# Patient Record
Sex: Male | Born: 1980 | Race: White | Hispanic: No | Marital: Single | Smoking: Former smoker
Health system: Southern US, Community
[De-identification: ages and names within clinical notes are randomized; demographics above are authoritative.]

## PROBLEM LIST (undated history)

## (undated) DIAGNOSIS — F419 Anxiety disorder, unspecified: Secondary | ICD-10-CM

## (undated) DIAGNOSIS — K589 Irritable bowel syndrome without diarrhea: Secondary | ICD-10-CM

## (undated) DIAGNOSIS — E669 Obesity, unspecified: Secondary | ICD-10-CM

## (undated) DIAGNOSIS — F988 Other specified behavioral and emotional disorders with onset usually occurring in childhood and adolescence: Secondary | ICD-10-CM

## (undated) DIAGNOSIS — E785 Hyperlipidemia, unspecified: Secondary | ICD-10-CM

## (undated) DIAGNOSIS — S82899A Other fracture of unspecified lower leg, initial encounter for closed fracture: Secondary | ICD-10-CM

## (undated) DIAGNOSIS — G4730 Sleep apnea, unspecified: Secondary | ICD-10-CM

## (undated) DIAGNOSIS — F32A Depression, unspecified: Secondary | ICD-10-CM

## (undated) DIAGNOSIS — K219 Gastro-esophageal reflux disease without esophagitis: Secondary | ICD-10-CM

## (undated) DIAGNOSIS — I10 Essential (primary) hypertension: Secondary | ICD-10-CM

## (undated) HISTORY — DX: Obesity, unspecified: E66.9

## (undated) HISTORY — PX: TONSILLECTOMY: SUR1361

## (undated) HISTORY — DX: Other specified behavioral and emotional disorders with onset usually occurring in childhood and adolescence: F98.8

## (undated) HISTORY — DX: Depression, unspecified: F32.A

## (undated) HISTORY — DX: Anxiety disorder, unspecified: F41.9

## (undated) HISTORY — DX: Irritable bowel syndrome, unspecified: K58.9

## (undated) HISTORY — PX: ANKLE FRACTURE SURGERY: SHX122

## (undated) HISTORY — DX: Hyperlipidemia, unspecified: E78.5

## (undated) HISTORY — DX: Gastro-esophageal reflux disease without esophagitis: K21.9

## (undated) HISTORY — DX: Essential (primary) hypertension: I10

## (undated) HISTORY — DX: Sleep apnea, unspecified: G47.30

## (undated) HISTORY — PX: CIRCUMCISION: SHX1350

## (undated) HISTORY — DX: Other fracture of unspecified lower leg, initial encounter for closed fracture: S82.899A

## (undated) HISTORY — PX: WISDOM TOOTH EXTRACTION: SHX21

---

## 2004-01-28 ENCOUNTER — Encounter: Admission: RE | Admit: 2004-01-28 | Discharge: 2004-01-28 | Payer: Self-pay | Admitting: Specialist

## 2006-07-22 ENCOUNTER — Ambulatory Visit (HOSPITAL_COMMUNITY): Admission: RE | Admit: 2006-07-22 | Discharge: 2006-07-22 | Payer: Self-pay | Admitting: Otolaryngology

## 2006-07-28 ENCOUNTER — Emergency Department (HOSPITAL_COMMUNITY): Admission: EM | Admit: 2006-07-28 | Discharge: 2006-07-28 | Payer: Self-pay | Admitting: Emergency Medicine

## 2010-11-03 ENCOUNTER — Emergency Department (HOSPITAL_COMMUNITY)
Admission: EM | Admit: 2010-11-03 | Discharge: 2010-11-03 | Disposition: A | Discharge status: Home / Self Care | Payer: BC Managed Care – PPO | Attending: Emergency Medicine | Admitting: Emergency Medicine

## 2010-11-03 DIAGNOSIS — H9190 Unspecified hearing loss, unspecified ear: Secondary | ICD-10-CM | POA: Insufficient documentation

## 2010-11-03 DIAGNOSIS — E669 Obesity, unspecified: Secondary | ICD-10-CM | POA: Insufficient documentation

## 2010-11-03 DIAGNOSIS — H9209 Otalgia, unspecified ear: Secondary | ICD-10-CM | POA: Insufficient documentation

## 2010-11-03 DIAGNOSIS — H60399 Other infective otitis externa, unspecified ear: Secondary | ICD-10-CM | POA: Insufficient documentation

## 2010-11-04 ENCOUNTER — Emergency Department (HOSPITAL_COMMUNITY)
Admission: EM | Admit: 2010-11-04 | Discharge: 2010-11-04 | Disposition: A | Discharge status: Home / Self Care | Payer: BC Managed Care – PPO | Attending: Emergency Medicine | Admitting: Emergency Medicine

## 2010-11-04 DIAGNOSIS — H60399 Other infective otitis externa, unspecified ear: Secondary | ICD-10-CM | POA: Insufficient documentation

## 2011-12-04 ENCOUNTER — Emergency Department (HOSPITAL_COMMUNITY): Payer: BC Managed Care – PPO

## 2011-12-04 ENCOUNTER — Encounter (HOSPITAL_COMMUNITY): Payer: Self-pay | Admitting: *Deleted

## 2011-12-04 ENCOUNTER — Emergency Department (HOSPITAL_COMMUNITY)
Admission: EM | Admit: 2011-12-04 | Discharge: 2011-12-04 | Disposition: A | Discharge status: Home / Self Care | Payer: BC Managed Care – PPO | Attending: Emergency Medicine | Admitting: Emergency Medicine

## 2011-12-04 DIAGNOSIS — R079 Chest pain, unspecified: Secondary | ICD-10-CM | POA: Insufficient documentation

## 2011-12-04 LAB — POCT I-STAT, CHEM 8
Calcium, Ion: 1.24 mmol/L — ABNORMAL HIGH (ref 1.12–1.23)
Chloride: 103 mEq/L (ref 96–112)
HCT: 42 % (ref 39.0–52.0)
Hemoglobin: 14.3 g/dL (ref 13.0–17.0)
Sodium: 140 mEq/L (ref 135–145)
TCO2: 25 mmol/L (ref 0–100)

## 2011-12-04 LAB — CBC
Hemoglobin: 14.9 g/dL (ref 13.0–17.0)
MCH: 31.8 pg (ref 26.0–34.0)
MCV: 89.5 fL (ref 78.0–100.0)
Platelets: 256 10*3/uL (ref 150–400)
RBC: 4.68 MIL/uL (ref 4.22–5.81)
WBC: 10.4 10*3/uL (ref 4.0–10.5)

## 2011-12-04 LAB — POCT I-STAT TROPONIN I: Troponin i, poc: 0 ng/mL (ref 0.00–0.08)

## 2011-12-04 MED ORDER — OXYCODONE-ACETAMINOPHEN 5-325 MG PO TABS
1.0000 | ORAL_TABLET | Freq: Once | ORAL | Status: AC
  Administered 2011-12-04: 1 via ORAL
  Filled 2011-12-04: qty 1

## 2011-12-04 MED ORDER — IBUPROFEN 600 MG PO TABS: 600.0000 mg | ORAL_TABLET | Freq: Four times a day (QID) | ORAL | Status: AC | PRN

## 2011-12-04 MED ORDER — OXYCODONE-ACETAMINOPHEN 5-325 MG PO TABS: 1.0000 | ORAL_TABLET | Freq: Four times a day (QID) | ORAL | Status: AC | PRN

## 2011-12-04 NOTE — ED Provider Notes (Signed)
History     CSN: [id number]  Arrival date & time 12/04/11  0007   First MD Initiated Contact with Patient 12/04/11 0150      Chief Complaint  Patient presents with  . Chest Pain    chest tightness    (Consider location/radiation/quality/duration/timing/severity/associated sxs/prior treatment) Patient is a 31 y.o. male presenting with chest pain. The history is provided by the patient.  Chest Pain Primary symptoms include shortness of breath and dizziness. Pertinent negatives for primary symptoms include no abdominal pain, no nausea and no vomiting.  Dizziness does not occur with nausea, vomiting or weakness.  Pertinent negatives for associated symptoms include no numbness and no weakness.    patient's had intermittent chest pain for the last month. He states chest pain is resolved he now has pain in his left armpit. It is worse with movement and certain positions. It began after she reached in a certain position. Tonight he had episode of lightheadedness with the episode. No cough. He location get some shortness of breath. No nausea vomiting diarrhea. He does not smoke. He does not have a family history of early cardiac disease.  History reviewed. No pertinent past medical history.  Past Surgical History  Procedure Date  . Tonsillectomy     No family history on file.  History  Substance Use Topics  . Smoking status: Never Smoker   . Smokeless tobacco: Not on file  . Alcohol Use: Yes     occasional      Review of Systems  Constitutional: Negative for activity change and appetite change.  HENT: Negative for neck stiffness.   Eyes: Negative for pain.  Respiratory: Positive for shortness of breath. Negative for chest tightness.   Cardiovascular: Positive for chest pain. Negative for leg swelling.  Gastrointestinal: Negative for nausea, vomiting, abdominal pain and diarrhea.  Genitourinary: Negative for flank pain.  Musculoskeletal: Negative for back pain.  Skin:  Negative for rash.  Neurological: Positive for dizziness. Negative for weakness, numbness and headaches.  Psychiatric/Behavioral: Negative for behavioral problems.    Allergies  Review of patient's allergies indicates no known allergies.  Home Medications   Current Outpatient Rx  Name Route Sig Dispense Refill  . ASPIRIN 81 MG PO CHEW Oral Chew 81 mg by mouth once.    . ASPIRIN EC 81 MG PO TBEC Oral Take 81 mg by mouth daily.    Marland Kitchen FEXOFENADINE HCL 180 MG PO TABS Oral Take 180 mg by mouth daily.    Marland Kitchen NITROGLYCERIN 0.4 MG SL SUBL Sublingual Place 0.4 mg under the tongue every 5 (five) minutes as needed. For chest pain-one dose toinight    . IBUPROFEN 600 MG PO TABS Oral Take 1 tablet (600 mg total) by mouth every 6 (six) hours as needed for pain. 20 tablet 0  . OXYCODONE-ACETAMINOPHEN 5-325 MG PO TABS Oral Take 1-2 tablets by mouth every 6 (six) hours as needed for pain. 20 tablet 0    BP 122/83  Pulse 77  Temp 98 F (36.7 C) (Oral)  Resp 20  Ht 6\' 1"  (1.854 m)  Wt 340 lb (154.223 kg)  BMI 44.86 kg/m2  SpO2 96%  Physical Exam  Nursing note and vitals reviewed. Constitutional: He is oriented to person, place, and time. He appears well-developed and well-nourished.       Patient is obese  HENT:  Head: Normocephalic and atraumatic.  Eyes: EOM are normal. Pupils are equal, round, and reactive to light.  Neck: Normal range of motion. Neck  supple.  Cardiovascular: Normal rate, regular rhythm and normal heart sounds.   No murmur heard. Pulmonary/Chest: Effort normal and breath sounds normal. He exhibits tenderness.       Tenderness in left axilla or area trapezius. No fluctuance. No mass. No erythema.  Abdominal: Soft. Bowel sounds are normal. He exhibits no distension and no mass. There is no tenderness. There is no rebound and no guarding.  Musculoskeletal: Normal range of motion. He exhibits no edema.  Neurological: He is alert and oriented to person, place, and time. No cranial  nerve deficit.  Skin: Skin is warm and dry.  Psychiatric: He has a normal mood and affect.    ED Course  Procedures (including critical care time)  Labs Reviewed  POCT I-STAT, CHEM 8 - Abnormal; Notable for the following:    Calcium, Ion 1.24 (*)     All other components within normal limits  CBC  D-DIMER, QUANTITATIVE  POCT I-STAT TROPONIN I   Dg Chest 2 View  12/04/2011  *RADIOLOGY REPORT*  Clinical Data: Chest pain.  CHEST - 2 VIEW  Comparison: None.  Findings: There are low lung volumes with resulting bibasilar atelectasis.  The lungs are otherwise clear.  There is no pleural effusion or pneumothorax.  Heart size and mediastinal contours are normal.  The osseous structures appear unremarkable.  IMPRESSION: Bibasilar atelectasis.  No other active cardiopulmonary process.  Original Report Authenticated By: Gerrianne Scale, M.D.     1. Chest pain      Date: 12/04/2011  Rate: 70  Rhythm: normal sinus rhythm  QRS Axis: normal  Intervals: normal  ST/T Wave abnormalities: normal  Conduction Disutrbances:none  Narrative Interpretation:   Old EKG Reviewed: none available   MDM  Patient with left-sided chest pain. EKG is reassuring as are cardiac enzymes and d-dimer. Patient is very tender over trapezius and this may be muscular. Patient be discharged home with pain medications and will followup as needed.        Juliet Rude. Rubin Payor, MD 12/04/11 [phone]

## 2011-12-04 NOTE — ED Notes (Signed)
31/M brought himself to er for c/o tightness is chest that began yesterday morning. Pt stated, "My chest started hurting again last night when I was trying to go to sleep." Pt also c/o dizziness at onset of symptoms. Pt denies chest pain at this time. Pt aao x 4, placed on cardiac monitor and spo2 monitor. ekg complete. Given to md. Awaiting new orders. Will continue to monitor pt.

## 2011-12-04 NOTE — ED Notes (Signed)
Pt ambulatory to bathroom with balanced and steady gait. NAD at this time. Will continue to monitor pt.

## 2011-12-04 NOTE — ED Notes (Signed)
Pt has had intermittent chest tightness and L axial pain x 1 month.  Tonight he began feeling lightheaded as well as having L sided chest tightness, so he decided to come in.  Pt thinks he may have may have injured his arm by overstretching, but he's not sure.

## 2013-03-15 ENCOUNTER — Other Ambulatory Visit: Payer: Self-pay | Admitting: Family Medicine

## 2013-03-15 DIAGNOSIS — H547 Unspecified visual loss: Secondary | ICD-10-CM

## 2013-03-20 ENCOUNTER — Ambulatory Visit
Admission: RE | Admit: 2013-03-20 | Discharge: 2013-03-20 | Disposition: A | Discharge status: Home / Self Care | Payer: BC Managed Care – PPO | Source: Ambulatory Visit | Attending: Family Medicine | Admitting: Family Medicine

## 2013-03-20 DIAGNOSIS — H547 Unspecified visual loss: Secondary | ICD-10-CM

## 2015-01-21 ENCOUNTER — Encounter (INDEPENDENT_AMBULATORY_CARE_PROVIDER_SITE_OTHER): Payer: BLUE CROSS/BLUE SHIELD | Admitting: Ophthalmology

## 2015-01-21 DIAGNOSIS — H5213 Myopia, bilateral: Secondary | ICD-10-CM | POA: Diagnosis not present

## 2015-01-21 DIAGNOSIS — G453 Amaurosis fugax: Secondary | ICD-10-CM

## 2015-01-21 DIAGNOSIS — H43813 Vitreous degeneration, bilateral: Secondary | ICD-10-CM

## 2015-01-21 DIAGNOSIS — H538 Other visual disturbances: Secondary | ICD-10-CM | POA: Diagnosis not present

## 2015-04-16 ENCOUNTER — Emergency Department (HOSPITAL_COMMUNITY)
Admission: EM | Admit: 2015-04-16 | Discharge: 2015-04-16 | Disposition: A | Discharge status: Home / Self Care | Payer: BLUE CROSS/BLUE SHIELD | Attending: Emergency Medicine | Admitting: Emergency Medicine

## 2015-04-16 ENCOUNTER — Emergency Department (HOSPITAL_COMMUNITY): Payer: BLUE CROSS/BLUE SHIELD

## 2015-04-16 ENCOUNTER — Other Ambulatory Visit: Payer: Self-pay | Admitting: Gastroenterology

## 2015-04-16 DIAGNOSIS — R1900 Intra-abdominal and pelvic swelling, mass and lump, unspecified site: Secondary | ICD-10-CM | POA: Insufficient documentation

## 2015-04-16 DIAGNOSIS — Z79899 Other long term (current) drug therapy: Secondary | ICD-10-CM | POA: Insufficient documentation

## 2015-04-16 DIAGNOSIS — K219 Gastro-esophageal reflux disease without esophagitis: Secondary | ICD-10-CM

## 2015-04-16 DIAGNOSIS — R1013 Epigastric pain: Secondary | ICD-10-CM | POA: Diagnosis present

## 2015-04-16 LAB — URINALYSIS, ROUTINE W REFLEX MICROSCOPIC
Bilirubin Urine: NEGATIVE
Glucose, UA: NEGATIVE mg/dL
Hgb urine dipstick: NEGATIVE
Ketones, ur: NEGATIVE mg/dL
LEUKOCYTES UA: NEGATIVE
NITRITE: NEGATIVE
PH: 5.5 (ref 5.0–8.0)
Protein, ur: NEGATIVE mg/dL
SPECIFIC GRAVITY, URINE: 1.016 (ref 1.005–1.030)

## 2015-04-16 LAB — COMPREHENSIVE METABOLIC PANEL
ALBUMIN: 4.2 g/dL (ref 3.5–5.0)
ALT: 64 U/L — ABNORMAL HIGH (ref 17–63)
ANION GAP: 8 (ref 5–15)
AST: 40 U/L (ref 15–41)
Alkaline Phosphatase: 51 U/L (ref 38–126)
BILIRUBIN TOTAL: 0.5 mg/dL (ref 0.3–1.2)
BUN: 9 mg/dL (ref 6–20)
CO2: 26 mmol/L (ref 22–32)
Calcium: 9.4 mg/dL (ref 8.9–10.3)
Chloride: 103 mmol/L (ref 101–111)
Creatinine, Ser: 1.06 mg/dL (ref 0.61–1.24)
GLUCOSE: 98 mg/dL (ref 65–99)
POTASSIUM: 4 mmol/L (ref 3.5–5.1)
Sodium: 137 mmol/L (ref 135–145)
TOTAL PROTEIN: 7 g/dL (ref 6.5–8.1)

## 2015-04-16 LAB — CBC
HEMATOCRIT: 44.4 % (ref 39.0–52.0)
HEMOGLOBIN: 15.5 g/dL (ref 13.0–17.0)
MCH: 32.2 pg (ref 26.0–34.0)
MCHC: 34.9 g/dL (ref 30.0–36.0)
MCV: 92.3 fL (ref 78.0–100.0)
Platelets: 261 10*3/uL (ref 150–400)
RBC: 4.81 MIL/uL (ref 4.22–5.81)
RDW: 12.7 % (ref 11.5–15.5)
WBC: 9.3 10*3/uL (ref 4.0–10.5)

## 2015-04-16 LAB — TROPONIN I

## 2015-04-16 LAB — LIPASE, BLOOD: Lipase: 31 U/L (ref 11–51)

## 2015-04-16 MED ORDER — PANTOPRAZOLE SODIUM 40 MG PO TBEC
40.0000 mg | DELAYED_RELEASE_TABLET | Freq: Once | ORAL | Status: AC
  Administered 2015-04-16: 40 mg via ORAL
  Filled 2015-04-16: qty 1

## 2015-04-16 MED ORDER — PANTOPRAZOLE SODIUM 40 MG PO TBEC: 40.0000 mg | DELAYED_RELEASE_TABLET | Freq: Every day | ORAL | Status: DC

## 2015-04-16 NOTE — ED Provider Notes (Signed)
CSN: [id number]     Arrival date & time 04/16/15  1611 History   First MD Initiated Contact with Patient 04/16/15 1840     Chief Complaint  Patient presents with  . Abdominal Pain     (Consider location/radiation/quality/duration/timing/severity/associated sxs/prior Treatment) Patient is a 34 y.o. male presenting with abdominal pain. The history is provided by the patient.  Abdominal Pain He has been having epigastric pain with radiation to the back. This started 4 days ago. Is worse with eating and worse with laying flat and better if he stands up or if he doesn't eat. There was associated dyspnea and some diaphoresis but no nausea. He saw a gastroenterologist today who added scheduled him for an ultrasound to rule out gallstones and had ordered him some medicine for acid reflux which he has not picked up yet. He states he is currently symptom-free. He came to the ED today because he had noted some swelling in the left upper abdomen which has subsided. He is not able to rate his pain currently. He does describe it as a dull, burning, pressure type of pain. He does have history of hypertension but no history of diabetes or hyperlipidemia and is a nonsmoker.  No past medical history on file. Past Surgical History  Procedure Laterality Date  . Tonsillectomy     No family history on file. Social History  Substance Use Topics  . Smoking status: Never Smoker   . Smokeless tobacco: Not on file  . Alcohol Use: Yes     Comment: occasional    Review of Systems  Gastrointestinal: Positive for abdominal pain.  All other systems reviewed and are negative.     Allergies  Asa  Home Medications   Prior to Admission medications   Medication Sig Start Date End Date Taking? Authorizing Provider  fexofenadine (ALLEGRA) 180 MG tablet Take 180 mg by mouth daily.   Yes Historical Provider, MD  lisinopril (PRINIVIL,ZESTRIL) 10 MG tablet Take 10 mg by mouth daily. 04/01/15  Yes Historical  Provider, MD  nitroGLYCERIN (NITROSTAT) 0.4 MG SL tablet Place 0.4 mg under the tongue every 5 (five) minutes as needed. For chest pain-one dose toinight   Yes Historical Provider, MD   BP 128/71 mmHg  Pulse 98  Temp(Src) 98.7 F (37.1 C) (Oral)  Resp 20  SpO2 95% Physical Exam  Nursing note and vitals reviewed.  Obese 34 year old male, resting comfortably and in no acute distress. Vital signs are normal. Oxygen saturation is 95%, which is normal. Head is normocephalic and atraumatic. PERRLA, EOMI. Oropharynx is clear. Neck is nontender and supple without adenopathy or JVD. Back is nontender and there is no CVA tenderness. Lungs are clear without rales, wheezes, or rhonchi. Chest is nontender. Heart has regular rate and rhythm without murmur. Abdomen is soft, flat, with mild epigastric tenderness. There is no rebound or guarding. There are no masses or hepatosplenomegaly and peristalsis is normoactive. Extremities have 1+ edema, full range of motion is present. Skin is warm and dry without rash. Neurologic: Mental status is normal, cranial nerves are intact, there are no motor or sensory deficits.  ED Course  Procedures (including critical care time) Labs Review Results for orders placed or performed during the hospital encounter of 04/16/15  Lipase, blood  Result Value Ref Range   Lipase 31 11 - 51 U/L  Comprehensive metabolic panel  Result Value Ref Range   Sodium 137 135 - 145 mmol/L   Potassium 4.0 3.5 - 5.1 mmol/L  Chloride 103 101 - 111 mmol/L   CO2 26 22 - 32 mmol/L   Glucose, Bld 98 65 - 99 mg/dL   BUN 9 6 - 20 mg/dL   Creatinine, Ser 1.61 0.61 - 1.24 mg/dL   Calcium 9.4 8.9 - 09.6 mg/dL   Total Protein 7.0 6.5 - 8.1 g/dL   Albumin 4.2 3.5 - 5.0 g/dL   AST 40 15 - 41 U/L   ALT 64 (H) 17 - 63 U/L   Alkaline Phosphatase 51 38 - 126 U/L   Total Bilirubin 0.5 0.3 - 1.2 mg/dL   GFR calc non Af Amer >60 >60 mL/min   GFR calc Af Amer >60 >60 mL/min   Anion gap 8 5 -  15  CBC  Result Value Ref Range   WBC 9.3 4.0 - 10.5 K/uL   RBC 4.81 4.22 - 5.81 MIL/uL   Hemoglobin 15.5 13.0 - 17.0 g/dL   HCT 04.5 40.9 - 81.1 %   MCV 92.3 78.0 - 100.0 fL   MCH 32.2 26.0 - 34.0 pg   MCHC 34.9 30.0 - 36.0 g/dL   RDW 91.4 78.2 - 95.6 %   Platelets 261 150 - 400 K/uL    Imaging Review US Abdomen Complete  04/16/2015  CLINICAL DATA:  Left upper quadrant pain since 4 days ago.  Obesity. EXAM: ULTRASOUND ABDOMEN COMPLETE COMPARISON:  None. FINDINGS: Gallbladder: No gallstones or wall thickening visualized. No sonographic Murphy sign noted. Common bile duct: Diameter: 4 mm Liver: Diffuse coarse increased echogenicity. Liver is difficult to visualize and penetrate penetrate with ultrasound. No intrahepatic biliary dilatation. Limited assessment for a focal hepatic abnormality. IVC: Not visualized Pancreas: Not visualized Spleen: Size and appearance within normal limits. Right Kidney: Length: 11 cm. Echogenicity within normal limits. No mass or hydronephrosis visualized. Left Kidney: Length: 11.7 cm. Echogenicity within normal limits. No mass or hydronephrosis visualized. Abdominal aorta: No aneurysm visualized. Other findings: None. IMPRESSION: Limited exam because of obesity. Normal gallbladder without gallstones or biliary dilatation Marked hepatic steatosis limiting liver evaluation by ultrasound Nonvisualization of the IVC and pancreas Electronically Signed   By: Judie Petit.  Shick M.D.   On: 04/16/2015 20:16   I have personally reviewed and evaluated these images and lab results as part of my medical decision-making.   EKG Interpretation   Date/Time:  Tuesday April 16 2015 19:23:10 EST Ventricular Rate:  78 PR Interval:  189 QRS Duration: 98 QT Interval:  375 QTC Calculation: 427 R Axis:   1 Text Interpretation:  Sinus rhythm ST elev, probable normal early repol  pattern Baseline wander in lead(s) V2 When compared with ECG of 12/04/2011,  No significant change was found  Confirmed by [facility]  MD, Ashritha Desrosiers ([id number]) on  04/16/2015 7:53:53 PM      MDM   Final diagnoses:  Epigastric pain    Epigastric pain with radiation to the back which is suspicious for either ulcer or acid reflux. ECG and troponin will recheck to rule out cardiac etiology. Abdominal ultrasound will be obtained although his symptoms point more to the left upper abdomen then to the right upper abdomen.  Ultrasound is unremarkable. Laboratory workup is also unremarkable. Patient is reassured of these findings and is discharged with prescription for pantoprazole. He is to follow-up with his gastroenterologist.  Dione Booze, MD 04/16/15 2105

## 2015-04-16 NOTE — Discharge Instructions (Signed)
Gastroesophageal Reflux Disease, Adult Normally, food travels down the esophagus and stays in the stomach to be digested. However, when a person has gastroesophageal reflux disease (GERD), food and stomach acid move back up into the esophagus. When this happens, the esophagus becomes sore and inflamed. Over time, GERD can create small holes (ulcers) in the lining of the esophagus.  CAUSES This condition is caused by a problem with the muscle between the esophagus and the stomach (lower esophageal sphincter, or LES). Normally, the LES muscle closes after food passes through the esophagus to the stomach. When the LES is weakened or abnormal, it does not close properly, and that allows food and stomach acid to go back up into the esophagus. The LES can be weakened by certain dietary substances, medicines, and medical conditions, including:  Tobacco use.  Pregnancy.  Having a hiatal hernia.  Heavy alcohol use.  Certain foods and beverages, such as coffee, chocolate, onions, and peppermint. RISK FACTORS This condition is more likely to develop in:  People who have an increased body weight.  People who have connective tissue disorders.  People who use NSAID medicines. SYMPTOMS Symptoms of this condition include:  Heartburn.  Difficult or painful swallowing.  The feeling of having a lump in the throat.  Abitter taste in the mouth.  Bad breath.  Having a large amount of saliva.  Having an upset or bloated stomach.  Belching.  Chest pain.  Shortness of breath or wheezing.  Ongoing (chronic) cough or a night-time cough.  Wearing away of tooth enamel.  Weight loss. Different conditions can cause chest pain. Make sure to see your health care provider if you experience chest pain. DIAGNOSIS Your health care provider will take a medical history and perform a physical exam. To determine if you have mild or severe GERD, your health care provider may also monitor how you respond  to treatment. You may also have other tests, including:  An endoscopy toexamine your stomach and esophagus with a small camera.  A test thatmeasures the acidity level in your esophagus.  A test thatmeasures how much pressure is on your esophagus.  A barium swallow or modified barium swallow to show the shape, size, and functioning of your esophagus. TREATMENT The goal of treatment is to help relieve your symptoms and to prevent complications. Treatment for this condition may vary depending on how severe your symptoms are. Your health care provider may recommend:  Changes to your diet.  Medicine.  Surgery. HOME CARE INSTRUCTIONS Diet  Follow a diet as recommended by your health care provider. This may involve avoiding foods and drinks such as:  Coffee and tea (with or without caffeine).  Drinks that containalcohol.  Energy drinks and sports drinks.  Carbonated drinks or sodas.  Chocolate and cocoa.  Peppermint and mint flavorings.  Garlic and onions.  Horseradish.  Spicy and acidic foods, including peppers, chili powder, curry powder, vinegar, hot sauces, and barbecue sauce.  Citrus fruit juices and citrus fruits, such as oranges, lemons, and limes.  Tomato-based foods, such as red sauce, chili, salsa, and pizza with red sauce.  Fried and fatty foods, such as donuts, french fries, potato chips, and high-fat dressings.  High-fat meats, such as hot dogs and fatty cuts of red and [place] meats, such as rib eye steak, sausage, ham, and bacon.  High-fat dairy items, such as whole milk, butter, and cream cheese.  Eat small, frequent meals instead of large meals.  Avoid drinking large amounts of liquid with your   meals.  Avoid eating meals during the 2-3 hours before bedtime.  Avoid lying down right after you eat.  Do not exercise right after you eat. General Instructions  Pay attention to any changes in your symptoms.  Take over-the-counter and prescription  medicines only as told by your health care provider. Do not take aspirin, ibuprofen, or other NSAIDs unless your health care provider told you to do so.  Do not use any tobacco products, including cigarettes, chewing tobacco, and e-cigarettes. If you need help quitting, ask your health care provider.  Wear loose-fitting clothing. Do not wear anything tight around your waist that causes pressure on your abdomen.  Raise (elevate) the head of your bed 6 inches (15cm).  Try to reduce your stress, such as with yoga or meditation. If you need help reducing stress, ask your health care provider.  If you are overweight, reduce your weight to an amount that is healthy for you. Ask your health care provider for guidance about a safe weight loss goal.  Keep all follow-up visits as told by your health care provider. This is important. SEEK MEDICAL CARE IF:  You have new symptoms.  You have unexplained weight loss.  You have difficulty swallowing, or it hurts to swallow.  You have wheezing or a persistent cough.  Your symptoms do not improve with treatment.  You have a hoarse voice. SEEK IMMEDIATE MEDICAL CARE IF:  You have pain in your arms, neck, jaw, teeth, or back.  You feel sweaty, dizzy, or light-headed.  You have chest pain or shortness of breath.  You vomit and your vomit looks like blood or coffee grounds.  You faint.  Your stool is bloody or black.  You cannot swallow, drink, or eat.   This information is not intended to replace advice given to you by your health care provider. Make sure you discuss any questions you have with your health care provider.   Document Released: 02/04/2005 Document Revised: 01/16/2015 Document Reviewed: 08/22/2014 Elsevier Interactive Patient Education 2016 [employer]  Pantoprazole tablets What is this medicine? PANTOPRAZOLE (pan TOE pra zole) prevents the production of acid in the stomach. It is used to treat gastroesophageal reflux  disease (GERD), inflammation of the esophagus, and Zollinger-Ellison syndrome. This medicine may be used for other purposes; ask your health care provider or pharmacist if you have questions. What should I tell my health care provider before I take this medicine? They need to know if you have any of these conditions: -liver disease -low levels of magnesium in the blood -an unusual or allergic reaction to omeprazole, lansoprazole, pantoprazole, rabeprazole, other medicines, foods, dyes, or preservatives -pregnant or trying to get pregnant -breast-feeding How should I use this medicine? Take this medicine by mouth. Swallow the tablets whole with a drink of water. Follow the directions on the prescription label. Do not crush, break, or chew. Take your medicine at regular intervals. Do not take your medicine more often than directed. Talk to your pediatrician regarding the use of this medicine in children. While this drug may be prescribed for children as young as 5 years for selected conditions, precautions do apply. Overdosage: If you think you have taken too much of this medicine contact a poison control center or emergency room at once. NOTE: This medicine is only for you. Do not share this medicine with others. What if I miss a dose? If you miss a dose, take it as soon as you can. If it is almost time for your   next dose, take only that dose. Do not take double or extra doses. What may interact with this medicine? Do not take this medicine with any of the following medications: -atazanavir -nelfinavir This medicine may also interact with the following medications: -ampicillin -delavirdine -erlotinib -iron salts -medicines for fungal infections like ketoconazole, itraconazole and voriconazole -methotrexate -mycophenolate mofetil -warfarin This list may not describe all possible interactions. Give your health care provider a list of all the medicines, herbs, non-prescription drugs, or  dietary supplements you use. Also tell them if you smoke, drink alcohol, or use illegal drugs. Some items may interact with your medicine. What should I watch for while using this medicine? It can take several days before your stomach pain gets better. Check with your doctor or health care professional if your condition does not start to get better, or if it gets worse. You may need blood work done while you are taking this medicine. What side effects may I notice from receiving this medicine? Side effects that you should report to your doctor or health care professional as soon as possible: -allergic reactions like skin rash, itching or hives, swelling of the face, lips, or tongue -bone, muscle or joint pain -breathing problems -chest pain or chest tightness -dark yellow or brown urine -dizziness -fast, irregular heartbeat -feeling faint or lightheaded -fever or sore throat -muscle spasm -palpitations -redness, blistering, peeling or loosening of the skin, including inside the mouth -seizures -tremors -unusual bleeding or bruising -unusually weak or tired -yellowing of the eyes or skin Side effects that usually do not require medical attention (Report these to your doctor or health care professional if they continue or are bothersome.): -constipation -diarrhea -dry mouth -headache -nausea This list may not describe all possible side effects. Call your doctor for medical advice about side effects. You may report side effects to FDA at 1-800-FDA-1088. Where should I keep my medicine? Keep out of the reach of children. Store at room temperature between 15 and 30 degrees C (59 and 86 degrees F). Protect from light and moisture. Throw away any unused medicine after the expiration date. NOTE: This sheet is a summary. It may not cover all possible information. If you have questions about this medicine, talk to your doctor, pharmacist, or health care provider.    2016, Elsevier/[employer]  Standard. (2014-06-15 14:45:56)  

## 2015-04-16 NOTE — ED Notes (Signed)
Called lab they are adding on troponin.

## 2015-04-16 NOTE — ED Notes (Signed)
Presents with left sided upper abdominal pain assocaited with "mild swelling" per pt-reports loose bowel movement yesterday denies nausea and vomitng abdominal pain began Friday. Saw Gastroenterologist today who wants to do a US next Friday for gall stones. Pt states the swelling and pain brought him here this evening. BS heard-unable to visualise swelling-pt states it has gone down over the last hour.

## 2015-04-26 ENCOUNTER — Other Ambulatory Visit: Payer: Self-pay

## 2015-11-14 DIAGNOSIS — I10 Essential (primary) hypertension: Secondary | ICD-10-CM | POA: Diagnosis not present

## 2015-11-14 DIAGNOSIS — E785 Hyperlipidemia, unspecified: Secondary | ICD-10-CM | POA: Diagnosis not present

## 2015-11-14 DIAGNOSIS — R300 Dysuria: Secondary | ICD-10-CM | POA: Diagnosis not present

## 2015-11-14 DIAGNOSIS — A09 Infectious gastroenteritis and colitis, unspecified: Secondary | ICD-10-CM | POA: Diagnosis not present

## 2015-11-19 DIAGNOSIS — E785 Hyperlipidemia, unspecified: Secondary | ICD-10-CM | POA: Diagnosis not present

## 2015-12-09 DIAGNOSIS — B349 Viral infection, unspecified: Secondary | ICD-10-CM | POA: Diagnosis not present

## 2015-12-09 DIAGNOSIS — M791 Myalgia: Secondary | ICD-10-CM | POA: Diagnosis not present

## 2016-06-10 DIAGNOSIS — E785 Hyperlipidemia, unspecified: Secondary | ICD-10-CM | POA: Diagnosis not present

## 2016-06-10 DIAGNOSIS — I10 Essential (primary) hypertension: Secondary | ICD-10-CM | POA: Diagnosis not present

## 2016-06-10 DIAGNOSIS — M25562 Pain in left knee: Secondary | ICD-10-CM | POA: Diagnosis not present

## 2016-06-10 DIAGNOSIS — K219 Gastro-esophageal reflux disease without esophagitis: Secondary | ICD-10-CM | POA: Diagnosis not present

## 2016-10-06 DIAGNOSIS — H00014 Hordeolum externum left upper eyelid: Secondary | ICD-10-CM | POA: Diagnosis not present

## 2016-10-06 DIAGNOSIS — H6092 Unspecified otitis externa, left ear: Secondary | ICD-10-CM | POA: Diagnosis not present

## 2016-12-09 DIAGNOSIS — H01004 Unspecified blepharitis left upper eyelid: Secondary | ICD-10-CM | POA: Diagnosis not present

## 2016-12-09 DIAGNOSIS — H01002 Unspecified blepharitis right lower eyelid: Secondary | ICD-10-CM | POA: Diagnosis not present

## 2016-12-09 DIAGNOSIS — H01001 Unspecified blepharitis right upper eyelid: Secondary | ICD-10-CM | POA: Diagnosis not present

## 2016-12-09 DIAGNOSIS — H0014 Chalazion left upper eyelid: Secondary | ICD-10-CM | POA: Diagnosis not present

## 2017-01-13 DIAGNOSIS — H01001 Unspecified blepharitis right upper eyelid: Secondary | ICD-10-CM | POA: Diagnosis not present

## 2017-01-13 DIAGNOSIS — H01002 Unspecified blepharitis right lower eyelid: Secondary | ICD-10-CM | POA: Diagnosis not present

## 2017-01-13 DIAGNOSIS — H0014 Chalazion left upper eyelid: Secondary | ICD-10-CM | POA: Diagnosis not present

## 2017-01-13 DIAGNOSIS — H01004 Unspecified blepharitis left upper eyelid: Secondary | ICD-10-CM | POA: Diagnosis not present

## 2017-03-10 DIAGNOSIS — A084 Viral intestinal infection, unspecified: Secondary | ICD-10-CM | POA: Diagnosis not present

## 2017-09-29 DIAGNOSIS — L719 Rosacea, unspecified: Secondary | ICD-10-CM | POA: Diagnosis not present

## 2017-12-29 DIAGNOSIS — Z5181 Encounter for therapeutic drug level monitoring: Secondary | ICD-10-CM | POA: Diagnosis not present

## 2017-12-29 DIAGNOSIS — L719 Rosacea, unspecified: Secondary | ICD-10-CM | POA: Diagnosis not present

## 2018-05-31 DIAGNOSIS — H9193 Unspecified hearing loss, bilateral: Secondary | ICD-10-CM | POA: Diagnosis not present

## 2018-05-31 DIAGNOSIS — J069 Acute upper respiratory infection, unspecified: Secondary | ICD-10-CM | POA: Diagnosis not present

## 2018-05-31 DIAGNOSIS — H6123 Impacted cerumen, bilateral: Secondary | ICD-10-CM | POA: Diagnosis not present

## 2018-07-09 DIAGNOSIS — J309 Allergic rhinitis, unspecified: Secondary | ICD-10-CM | POA: Diagnosis not present

## 2020-05-11 HISTORY — PX: ESOPHAGOGASTRODUODENOSCOPY: SHX1529

## 2020-11-28 ENCOUNTER — Emergency Department (HOSPITAL_BASED_OUTPATIENT_CLINIC_OR_DEPARTMENT_OTHER)
Admission: EM | Admit: 2020-11-28 | Discharge: 2020-11-29 | Disposition: A | Discharge status: Home / Self Care | Payer: 59 | Attending: Emergency Medicine | Admitting: Emergency Medicine

## 2020-11-28 ENCOUNTER — Other Ambulatory Visit: Payer: Self-pay

## 2020-11-28 ENCOUNTER — Encounter (HOSPITAL_BASED_OUTPATIENT_CLINIC_OR_DEPARTMENT_OTHER): Payer: Self-pay

## 2020-11-28 DIAGNOSIS — R1032 Left lower quadrant pain: Secondary | ICD-10-CM | POA: Insufficient documentation

## 2020-11-28 DIAGNOSIS — R1012 Left upper quadrant pain: Secondary | ICD-10-CM | POA: Diagnosis present

## 2020-11-28 LAB — COMPREHENSIVE METABOLIC PANEL
ALT: 27 U/L (ref 0–44)
AST: 19 U/L (ref 15–41)
Albumin: 4.5 g/dL (ref 3.5–5.0)
Alkaline Phosphatase: 49 U/L (ref 38–126)
Anion gap: 10 (ref 5–15)
BUN: 24 mg/dL — ABNORMAL HIGH (ref 6–20)
CO2: 27 mmol/L (ref 22–32)
Calcium: 9.1 mg/dL (ref 8.9–10.3)
Chloride: 102 mmol/L (ref 98–111)
Creatinine, Ser: 0.78 mg/dL (ref 0.61–1.24)
GFR, Estimated: >60 mL/min (ref >60)
Glucose, Bld: 100 mg/dL — ABNORMAL HIGH (ref 70–99)
Potassium: 3.9 mmol/L (ref 3.5–5.1)
Sodium: 139 mmol/L (ref 135–145)
Total Bilirubin: 0.7 mg/dL (ref 0.3–1.2)
Total Protein: 7.4 g/dL (ref 6.5–8.1)

## 2020-11-28 LAB — CBC WITH DIFFERENTIAL/PLATELET
Abs Immature Granulocytes: 0.06 10*3/uL (ref 0.00–0.07)
Basophils Absolute: 0 10*3/uL (ref 0.0–0.1)
Basophils Relative: 0 %
Eosinophils Absolute: 0.1 10*3/uL (ref 0.0–0.5)
Eosinophils Relative: 1 %
HCT: 43.3 % (ref 39.0–52.0)
Hemoglobin: 14.9 g/dL (ref 13.0–17.0)
Immature Granulocytes: 1 %
Lymphocytes Relative: 37 %
Lymphs Abs: 4.1 10*3/uL — ABNORMAL HIGH (ref 0.7–4.0)
MCH: 31 pg (ref 26.0–34.0)
MCHC: 34.4 g/dL (ref 30.0–36.0)
MCV: 90.2 fL (ref 80.0–100.0)
Monocytes Absolute: 0.8 10*3/uL (ref 0.1–1.0)
Monocytes Relative: 7 %
Neutro Abs: 6 10*3/uL (ref 1.7–7.7)
Neutrophils Relative %: 54 %
Platelets: 252 10*3/uL (ref 150–400)
RBC: 4.8 MIL/uL (ref 4.22–5.81)
RDW: 12.7 % (ref 11.5–15.5)
WBC: 11.1 10*3/uL — ABNORMAL HIGH (ref 4.0–10.5)
nRBC: 0 % (ref 0.0–0.2)

## 2020-11-28 LAB — LIPASE, BLOOD: Lipase: 15 U/L (ref 11–51)

## 2020-11-28 NOTE — ED Triage Notes (Signed)
Pt states he has had LUQ pain x few days and he noticed to lump on same site  this morning. Concerned of having hernia   PMX - GERD and he has been taking omeprazole

## 2020-11-29 ENCOUNTER — Emergency Department (HOSPITAL_BASED_OUTPATIENT_CLINIC_OR_DEPARTMENT_OTHER): Payer: 59

## 2020-11-29 ENCOUNTER — Encounter (HOSPITAL_BASED_OUTPATIENT_CLINIC_OR_DEPARTMENT_OTHER): Payer: Self-pay | Admitting: Radiology

## 2020-11-29 LAB — URINALYSIS, ROUTINE W REFLEX MICROSCOPIC
Bilirubin Urine: NEGATIVE
Glucose, UA: NEGATIVE mg/dL
Hgb urine dipstick: NEGATIVE
Ketones, ur: NEGATIVE mg/dL
Leukocytes,Ua: NEGATIVE
Nitrite: NEGATIVE
Protein, ur: NEGATIVE mg/dL
Specific Gravity, Urine: 1.026 (ref 1.005–1.030)
pH: 5.5 (ref 5.0–8.0)

## 2020-11-29 MED ORDER — IOHEXOL 350 MG/ML SOLN
100.0000 mL | Freq: Once | INTRAVENOUS | Status: AC | PRN
  Administered 2020-11-29: 100 mL via INTRAVENOUS

## 2020-11-29 NOTE — ED Notes (Signed)
This RN presented the AVS utilizing Teachback Method. Patient verbalizes understanding of Discharge Instructions. Opportunity for Questioning and Answers were provided. Patient Discharged from ED ambulatory to home via SELF.   

## 2020-11-30 NOTE — ED Provider Notes (Signed)
[facility] EMERGENCY DEPT Provider Note   CSN: [id number] Arrival date & time: 11/28/20  2050     History Chief Complaint  Patient presents with   Abdominal Pain    Gregory Gutierrez is a 40 y.o. male.  Has noticed a firm area in LUQ over last couple days after starting working out recently. Only there when he stands up. Goes away when he lies down. Concern for hernia vs mass. No n/v/d, constipation. Has indigestion for which he is working on medications as an outpatient.    Abdominal Pain     History reviewed. No pertinent past medical history.  There are no problems to display for this patient.   Past Surgical History:  Procedure Laterality Date   TONSILLECTOMY         History reviewed. No pertinent family history.  Social History   Tobacco Use   Smoking status: Never   Smokeless tobacco: Never  Substance Use Topics   Alcohol use: Yes    Comment: occasional   Drug use: No    Home Medications Prior to Admission medications   Medication Sig Start Date End Date Taking? Authorizing Provider  fexofenadine (ALLEGRA) 180 MG tablet Take 180 mg by mouth daily.    [provider]  lisinopril (PRINIVIL,ZESTRIL) 10 MG tablet Take 10 mg by mouth daily. 04/01/15   [provider]  nitroGLYCERIN (NITROSTAT) 0.4 MG SL tablet Place 0.4 mg under the tongue every 5 (five) minutes as needed. For chest pain-one dose toinight    [provider]  pantoprazole (PROTONIX) 40 MG tablet Take 1 tablet (40 mg total) by mouth daily. 04/16/15   Dione Booze, MD    Allergies    Jonne Ply [aspirin]  Review of Systems   Review of Systems  Gastrointestinal:  Positive for abdominal pain.  All other systems reviewed and are negative.  Physical Exam Updated Vital Signs BP (!) 145/95 (BP Location: Right Arm)   Pulse 72   Temp 97.8 F (36.6 C) (Oral)   Resp 16   Ht 6\' 1"  (1.854 m)   Wt (!) 165.6 kg   SpO2 96%   BMI 48.16 kg/m   Physical  Exam Vitals and nursing note reviewed.  Constitutional:      Appearance: He is well-developed.  HENT:     Head: Normocephalic and atraumatic.  Cardiovascular:     Rate and Rhythm: Normal rate.  Pulmonary:     Effort: Pulmonary effort is normal. No respiratory distress.  Abdominal:     General: There is no distension.     Palpations: Abdomen is soft.     Comments: Small firm area in LUQ where he describes. Only when standing. Not tender. No warmth, erythema or other e/o infection.  Musculoskeletal:        General: Normal range of motion.     Cervical back: Normal range of motion.  Skin:    General: Skin is warm and dry.  Neurological:     General: No focal deficit present.     Mental Status: He is alert.    ED Results / Procedures / Treatments   Labs (all labs ordered are listed, but only abnormal results are displayed) Labs Reviewed  COMPREHENSIVE METABOLIC PANEL - Abnormal; Notable for the following components:      Result Value   Glucose, Bld 100 (*)    BUN 24 (*)    All other components within normal limits  CBC WITH DIFFERENTIAL/PLATELET - Abnormal; Notable for the following  components:   WBC 11.1 (*)    Lymphs Abs 4.1 (*)    All other components within normal limits  LIPASE, BLOOD  URINALYSIS, ROUTINE W REFLEX MICROSCOPIC    EKG None  Radiology CT ABDOMEN PELVIS W CONTRAST  Result Date: 11/29/2020 CLINICAL DATA:  Abdomen distension left upper quadrant pain EXAM: CT ABDOMEN AND PELVIS WITH CONTRAST TECHNIQUE: Multidetector CT imaging of the abdomen and pelvis was performed using the standard protocol following bolus administration of intravenous contrast. CONTRAST:  OMNIPAQUE IOHEXOL 350 MG/ML SOLN COMPARISON:  Ultrasound 04/16/2015 FINDINGS: Lower chest: No acute abnormality. Hepatobiliary: No focal liver abnormality is seen. No gallstones, gallbladder wall thickening, or biliary dilatation. Pancreas: Unremarkable. No pancreatic ductal dilatation or  surrounding inflammatory changes. Spleen: Normal in size without focal abnormality. Adrenals/Urinary Tract: Adrenal glands are unremarkable. Kidneys are normal, without renal calculi, focal lesion, or hydronephrosis. Bladder is unremarkable. Stomach/Bowel: Stomach is within normal limits. Appendix appears normal. No evidence of bowel wall thickening, distention, or inflammatory changes. 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 Vascular/Lymphatic: No significant vascular findings are present. No enlarged abdominal or pelvic lymph nodes. Reproductive: Prostate is unremarkable. Other: No abdominal wall hernia or abnormality. No abdominopelvic ascites. Fat containing left inguinal hernia Musculoskeletal: No acute or significant osseous findings. IMPRESSION: 1. No CT evidence for acute intra-abdominal or pelvic abnormality. 2. 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 Electronically Signed   By: Jasmine Pang M.D.   On: 11/29/2020 03:15    Procedures Procedures   Medications Ordered in ED Medications  iohexol (OMNIPAQUE) 350 MG/ML injection 100 mL (100 mLs Intravenous Contrast Given 11/29/20 0301)    ED Course  I have reviewed the triage vital signs and the nursing notes.  Pertinent labs & imaging results that were available during my care of the patient were reviewed by me and considered in my medical decision making (see chart for details).    MDM Rules/Calculators/A&P                         No hernia. No obvious masses. No e/o obstruction, can fu w/ PCP for further eval of same.    Final Clinical Impression(s) / ED Diagnoses Final diagnoses:  Left lower quadrant abdominal pain    Rx / DC Orders ED Discharge Orders     None        Izea Livolsi, Barbara Cower, MD 11/30/20 [phone]

## 2020-12-26 ENCOUNTER — Encounter (HOSPITAL_BASED_OUTPATIENT_CLINIC_OR_DEPARTMENT_OTHER): Payer: Self-pay

## 2020-12-26 ENCOUNTER — Emergency Department (HOSPITAL_BASED_OUTPATIENT_CLINIC_OR_DEPARTMENT_OTHER)
Admission: EM | Admit: 2020-12-26 | Discharge: 2020-12-26 | Disposition: A | Discharge status: Home / Self Care | Payer: 59 | Attending: Emergency Medicine | Admitting: Emergency Medicine

## 2020-12-26 ENCOUNTER — Emergency Department (HOSPITAL_BASED_OUTPATIENT_CLINIC_OR_DEPARTMENT_OTHER): Payer: 59

## 2020-12-26 ENCOUNTER — Other Ambulatory Visit: Payer: Self-pay

## 2020-12-26 DIAGNOSIS — R079 Chest pain, unspecified: Secondary | ICD-10-CM | POA: Diagnosis present

## 2020-12-26 DIAGNOSIS — R0602 Shortness of breath: Secondary | ICD-10-CM | POA: Insufficient documentation

## 2020-12-26 LAB — CBC
HCT: 41.8 % (ref 39.0–52.0)
Hemoglobin: 14.9 g/dL (ref 13.0–17.0)
MCH: 31.8 pg (ref 26.0–34.0)
MCHC: 35.6 g/dL (ref 30.0–36.0)
MCV: 89.1 fL (ref 80.0–100.0)
Platelets: 215 10*3/uL (ref 150–400)
RBC: 4.69 MIL/uL (ref 4.22–5.81)
RDW: 12.5 % (ref 11.5–15.5)
WBC: 8.3 10*3/uL (ref 4.0–10.5)
nRBC: 0 % (ref 0.0–0.2)

## 2020-12-26 LAB — BASIC METABOLIC PANEL
Anion gap: 10 (ref 5–15)
BUN: 15 mg/dL (ref 6–20)
CO2: 23 mmol/L (ref 22–32)
Calcium: 9.8 mg/dL (ref 8.9–10.3)
Chloride: 105 mmol/L (ref 98–111)
Creatinine, Ser: 0.72 mg/dL (ref 0.61–1.24)
GFR, Estimated: >60 mL/min (ref >60)
Glucose, Bld: 105 mg/dL — ABNORMAL HIGH (ref 70–99)
Potassium: 3.7 mmol/L (ref 3.5–5.1)
Sodium: 138 mmol/L (ref 135–145)

## 2020-12-26 LAB — TROPONIN I (HIGH SENSITIVITY)
Troponin I (High Sensitivity): 6 ng/L (ref <18)
Troponin I (High Sensitivity): 3 ng/L (ref <18)

## 2020-12-26 MED ORDER — ALUM & MAG HYDROXIDE-SIMETH 200-200-20 MG/5ML PO SUSP
30.0000 mL | Freq: Once | ORAL | Status: AC
  Administered 2020-12-26: 30 mL via ORAL
  Filled 2020-12-26: qty 30

## 2020-12-26 NOTE — ED Provider Notes (Signed)
Signout from Dr. Adela Lank.  40 year old male here with on and off chest pain for the last 4 days.  Initial EKG chest x-ray and troponin are unremarkable.  Plan is for second troponin.  If negative patient can be discharged and follow-up with PCP. Physical Exam  BP 127/81 (BP Location: Left Arm)   Pulse 70   Temp 98.2 F (36.8 C) (Oral)   Resp 19   Ht 6\' 1"  (1.854 m)   Wt (!) 165.1 kg   SpO2 100%   BMI 48.02 kg/m   Physical Exam  ED Course/Procedures     Procedures  MDM  Delta troponin unchanged.  Reviewed with patient.  He is comfortable plan for trial of some acid medication and follow-up outpatient with primary care doctor.  Return instructions discussed       , MD 12/26/20 [phone]

## 2020-12-26 NOTE — ED Provider Notes (Signed)
[facility] EMERGENCY DEPT Provider Note   CSN: [id number] Arrival date & time: 12/26/20  0541     History Chief Complaint  Patient presents with   Chest Pain    Gregory Gutierrez is a 40 y.o. male.  40 yo M with a chief complaint of chest pain.  This been off and on for about 4 days now.  He is been to an [facility] walk-in clinic to be evaluated for this.  Was told he had an abnormal EKG but was not referred to cardiology.  He has had pain off and on.  Had it once with exercise otherwise happens more often at rest or when he is lying back flat.  Improves with sitting up straight.  Denies cough congestion or fever.  Denies trauma.  Feels like he has been having really bad reflux.  The pain this morning was described as sharp and is a burning and goes down the left arm.  He also had been checking his heart rate and felt like it was in the 50s.  Patient denies history of MI, denies hypertension hyperlipidemia diabetes or smoking.  Denies family history of MI.  Patient denies history of PE or DVT denies hemoptysis denies unilateral lower extremity edema denies recent surgery immobilization hospitalization estrogen use or history of cancer.    The history is provided by the patient.  Chest Pain Pain location:  L chest and L lateral chest Pain quality: aching, burning and sharp   Pain radiates to:  L arm Pain severity:  Moderate Onset quality:  Sudden Duration:  1 hour Timing:  Constant Progression:  Unchanged Chronicity:  Recurrent Relieved by:  Nothing Worsened by:  Certain positions Ineffective treatments:  None tried Associated symptoms: shortness of breath   Associated symptoms: no abdominal pain, no fever, no headache, no palpitations and no vomiting   Risk factors: male sex   Risk factors: no coronary artery disease, no high cholesterol, no hypertension, no prior DVT/PE and no smoking       History reviewed. No pertinent past medical history.  There are no  problems to display for this patient.   Past Surgical History:  Procedure Laterality Date   TONSILLECTOMY         No family history on file.  Social History   Tobacco Use   Smoking status: Never   Smokeless tobacco: Never  Substance Use Topics   Alcohol use: Yes    Comment: occasional   Drug use: No    Home Medications Prior to Admission medications   Medication Sig Start Date End Date Taking? Authorizing Provider  fexofenadine (ALLEGRA) 180 MG tablet Take 180 mg by mouth daily.    [provider]  lisinopril (PRINIVIL,ZESTRIL) 10 MG tablet Take 10 mg by mouth daily. 04/01/15   [provider]  nitroGLYCERIN (NITROSTAT) 0.4 MG SL tablet Place 0.4 mg under the tongue every 5 (five) minutes as needed. For chest pain-one dose toinight    [provider]  pantoprazole (PROTONIX) 40 MG tablet Take 1 tablet (40 mg total) by mouth daily. 04/16/15   Dione Booze, MD    Allergies    Patient has no active allergies.  Review of Systems   Review of Systems  Constitutional:  Negative for chills and fever.  HENT:  Negative for congestion and facial swelling.   Eyes:  Negative for discharge and visual disturbance.  Respiratory:  Positive for shortness of breath.   Cardiovascular:  Positive for chest pain. Negative for  palpitations.  Gastrointestinal:  Negative for abdominal pain, diarrhea and vomiting.  Musculoskeletal:  Negative for arthralgias and myalgias.  Skin:  Negative for color change and rash.  Neurological:  Negative for tremors, syncope and headaches.  Psychiatric/Behavioral:  Negative for confusion and dysphoric mood.    Physical Exam Updated Vital Signs BP 127/81 (BP Location: Left Arm)   Pulse 70   Temp 98.2 F (36.8 C) (Oral)   Resp 19   Ht 6\' 1"  (1.854 m)   Wt (!) 165.1 kg   SpO2 100%   BMI 48.02 kg/m   Physical Exam Vitals and nursing note reviewed.  Constitutional:      Appearance: He is well-developed.     Comments: BMI 48   HENT:     Head: Normocephalic and atraumatic.  Eyes:     Pupils: Pupils are equal, round, and reactive to light.  Neck:     Vascular: No JVD.  Cardiovascular:     Rate and Rhythm: Normal rate and regular rhythm.     Heart sounds: No murmur heard.   No friction rub. No gallop.  Pulmonary:     Effort: No respiratory distress.     Breath sounds: No wheezing.  Chest:     Chest wall: Tenderness present.     Comments: Some left-sided chest tenderness on palpation worst about the medial clavicular line about ribs 4 and 5. Abdominal:     General: There is no distension.     Tenderness: There is no abdominal tenderness. There is no guarding or rebound.  Musculoskeletal:        General: Normal range of motion.     Cervical back: Normal range of motion and neck supple.  Skin:    Coloration: Skin is not pale.     Findings: No rash.  Neurological:     Mental Status: He is alert and oriented to person, place, and time.  Psychiatric:        Behavior: Behavior normal.    ED Results / Procedures / Treatments   Labs (all labs ordered are listed, but only abnormal results are displayed) Labs Reviewed  BASIC METABOLIC PANEL - Abnormal; Notable for the following components:      Result Value   Glucose, Bld 105 (*)    All other components within normal limits  CBC  TROPONIN I (HIGH SENSITIVITY)    EKG EKG Interpretation  Date/Time:  Thursday December 26 2020 05:46:41 EDT Ventricular Rate:  71 PR Interval:  183 QRS Duration: 104 QT Interval:  401 QTC Calculation: 436 R Axis:   47 Text Interpretation: Sinus rhythm No significant change since last tracing Confirmed by [id number]) on 12/26/2020 6:02:33 AM  Radiology No results found.  Procedures Procedures   Medications Ordered in ED Medications  alum & mag hydroxide-simeth (MAALOX/MYLANTA) 200-200-20 MG/5ML suspension 30 mL (30 mLs Oral Given 12/26/20 0607)    ED Course  I have reviewed the triage vital signs and the  nursing notes.  Pertinent labs & imaging results that were available during my care of the patient were reviewed by me and considered in my medical decision making (see chart for details).    MDM Rules/Calculators/A&P                           40 yo M with a chief complaint of chest pain.  Is been going on for about 4 to 5 days.  Worsened this morning about an  hour ago and woke him up from sleep.  We will obtain a delta troponin.  Chest x-ray reassess.  Signed out to Dr. Charm Barges, please see his note for further details of care in the ED.  The patients results and plan were reviewed and discussed.   Any x-rays performed were independently reviewed by myself.   Differential diagnosis were considered with the presenting HPI.  Medications  alum & mag hydroxide-simeth (MAALOX/MYLANTA) 200-200-20 MG/5ML suspension 30 mL (30 mLs Oral Given 12/26/20 0607)    Vitals:   12/26/20 0548 12/26/20 0551  BP:  127/81  Pulse:  70  Resp:  19  Temp:  98.2 F (36.8 C)  TempSrc:  Oral  SpO2:  100%  Weight: (!) 165.1 kg   Height: 6\' 1"  (1.854 m)     Final diagnoses:  Nonspecific chest pain     Final Clinical Impression(s) / ED Diagnoses Final diagnoses:  Nonspecific chest pain    Rx / DC Orders ED Discharge Orders     None        , DO 12/26/20 12/28/20

## 2020-12-26 NOTE — ED Notes (Signed)
Wells Guiles (Emergency Contact): [phone]

## 2020-12-26 NOTE — ED Triage Notes (Signed)
Patient here POV from Home with Chest Pain.  Patient states he awoke this AM (approximately 1 hours PTA) with Left Sided CP that radiates down Left Arm. No Nausea. No Vomiting. No Diarrhea. Mild SOB.   BIB Wheelchair. A&Ox4. GCS 15. No Medications PTA.

## 2020-12-26 NOTE — Discharge Instructions (Signed)
Try pepcid or tagamet up to twice a day.  Try to avoid things that may make this worse, most commonly these are spicy foods tomato based products fatty foods chocolate and peppermint.  Alcohol and tobacco can also make this worse.  Return to the emergency department for sudden worsening pain fever or inability to eat or drink.  

## 2021-01-01 ENCOUNTER — Encounter: Payer: Self-pay | Admitting: Cardiology

## 2021-01-01 ENCOUNTER — Other Ambulatory Visit: Payer: Self-pay | Admitting: Cardiology

## 2021-01-01 ENCOUNTER — Ambulatory Visit: Payer: BLUE CROSS/BLUE SHIELD | Admitting: Cardiology

## 2021-01-01 ENCOUNTER — Ambulatory Visit (INDEPENDENT_AMBULATORY_CARE_PROVIDER_SITE_OTHER): Payer: 59

## 2021-01-01 ENCOUNTER — Other Ambulatory Visit: Payer: Self-pay

## 2021-01-01 VITALS — BP 116/80 | HR 69 | Ht 73.0 in | Wt 361.0 lb

## 2021-01-01 DIAGNOSIS — R079 Chest pain, unspecified: Secondary | ICD-10-CM | POA: Diagnosis not present

## 2021-01-01 DIAGNOSIS — R072 Precordial pain: Secondary | ICD-10-CM | POA: Diagnosis not present

## 2021-01-01 DIAGNOSIS — R001 Bradycardia, unspecified: Secondary | ICD-10-CM

## 2021-01-01 DIAGNOSIS — K219 Gastro-esophageal reflux disease without esophagitis: Secondary | ICD-10-CM

## 2021-01-01 NOTE — Progress Notes (Unsigned)
Patient enrolled for [employer] to mail a 3 day ZIO XT monitor to his home. 

## 2021-01-01 NOTE — Progress Notes (Signed)
Cardiology Office Note   Date:  01/01/2021   ID:  Kadarious, Dikes 01/11/1981, MRN [id number]  PCP:  Johny Blamer, MD  Cardiologist:   Rollene Rotunda, MD Referring:  Johny Blamer, MD  Chief Complaint  Patient presents with   Chest Pain       History of Present Illness: Gregory Gutierrez is a 40 y.o. male who presents for was referred by Johny Blamer, MD    He was in the emergency room on 12/26/2020 with chest discomfort.  I reviewed these records for this visit.  There was no objective evidence of ischemia.  Troponins were normal.  EKG was nonacute.  Chest x-ray demonstrated no acute disease.  He reports that he thinks he has reflux.  He has had this before.  He remembers all left upper quadrant her mid epigastric discomfort.  It is better if he is standing up and moving around.  However, when he went to the emergency room he also had some shortness of breath.  He wondered if he was having asthma or panic.  His heart rate was actually down to the 50s when all this was happening.  He actually was given Maalox in the emergency room and he said improvement.  I think he is able to do activities such as kickboxing and working out without bringing on any of the symptoms since that ER visit.  However, he has a Fitbit and he notices that at night his heart rate goes into the 40s typically when he is asleep.  I do see that his heart rate goes up when he is exercising and he has what seems to be an appropriate response.  He does have sleep apnea recently diagnosed and has been prescribed and started using CPAP.  He has not had any prior cardiac work-up.  He does not have any syncope.  He does not have PND or orthopnea.     Past Medical History:  Diagnosis Date   GERD (gastroesophageal reflux disease)    Sleep apnea     Past Surgical History:  Procedure Laterality Date   TONSILLECTOMY       Current Outpatient Medications  Medication Sig Dispense Refill   omeprazole (PRILOSEC) 40  MG capsule Take 40 mg by mouth 2 (two) times daily.     No current facility-administered medications for this visit.    Allergies:   Other    Social History:  The patient  reports that he has never smoked. He has never used smokeless tobacco. He reports current alcohol use. He reports that he does not use drugs.   Family History:  The patient's family history includes Cancer in his father; Pneumonia in his mother; Rheum arthritis in his mother.    ROS:  Please see the history of present illness.   Otherwise, review of systems are positive for none.   All other systems are reviewed and negative.    PHYSICAL EXAM: VS:  BP 116/80   Pulse 69   Ht 6\' 1"  (1.854 m)   Wt (!) 361 lb (163.7 kg)   SpO2 95%   BMI 47.63 kg/m  , BMI Body mass index is 47.63 kg/m. GENERAL:  Well appearing HEENT:  Pupils equal round and reactive, fundi not visualized, oral mucosa unremarkable NECK:  No jugular venous distention, waveform within normal limits, carotid upstroke brisk and symmetric, no bruits, no thyromegaly LYMPHATICS:  No cervical, inguinal adenopathy LUNGS:  Clear to auscultation bilaterally BACK:  No CVA tenderness  CHEST:  Unremarkable HEART:  PMI not displaced or sustained,S1 and S2 within normal limits, no S3, no S4, no clicks, no rubs, no murmurs ABD:  Flat, positive bowel sounds normal in frequency in pitch, no bruits, no rebound, no guarding, no midline pulsatile mass, no hepatomegaly, no splenomegaly EXT:  2 plus pulses throughout, no edema, no cyanosis no clubbing SKIN:  No rashes no nodules NEURO:  Cranial nerves II through XII grossly intact, motor grossly intact throughout PSYCH:  Cognitively intact, oriented to person place and time    EKG:  EKG is ordered today. The ekg ordered today demonstrates sinus rhythm, rate 69, axis within normal limits, intervals within normal limits, no acute ST-T wave changes.   Recent Labs: 11/28/2020: ALT 27 12/26/2020: BUN 15; Creatinine, Ser  0.72; Hemoglobin 14.9; Platelets 215; Potassium 3.7; Sodium 138    Lipid Panel No results found for: CHOL, TRIG, HDL, CHOLHDL, VLDL, LDLCALC, LDLDIRECT    Wt Readings from Last 3 Encounters:  01/01/21 (!) 361 lb (163.7 kg)  12/26/20 (!) 364 lb (165.1 kg)  11/28/20 (!) 365 lb (165.6 kg)      Other studies Reviewed: Additional studies/ records that were reviewed today include: ED records, primary care records. Review of the above records demonstrates:  Please see elsewhere in the note.     ASSESSMENT AND PLAN:  Chest pain:   His chest discomfort is predominantly nonanginal in his characteristics.  It is similar to previous reflux.  He requests referral to GI and I will facilitate this.  I think the pretest probability of obstructive coronary disease is low.  I am going to order a coronary calcium score.  I will have a low threshold for further testing although he and I discussed holding off on any stress testing given the low pretest probability.  Bradycardia: He does have bradycardia but during the sleeping hours.  I will order a 3-day monitor.  Dyslipidemia: His LDL is mildly elevated but he has been dieting since his November lab draw and requested repeat labs.  I will order this.   Current medicines are reviewed at length with the patient today.  The patient does not have concerns regarding medicines.  The following changes have been made:  no change  Labs/ tests ordered today include: None  Orders Placed This Encounter  Procedures   CT CARDIAC SCORING (SELF PAY ONLY)   Lipid panel   Ambulatory referral to Gastroenterology   EKG 12-Lead      Disposition:   FU with me as needed   Signed, Rollene Rotunda, MD  01/01/2021 4:58 PM    [facility]

## 2021-01-01 NOTE — Patient Instructions (Signed)
Medication Instructions:  Your Physician recommend you continue on your current medication as directed.    *If you need a refill on your cardiac medications before your next appointment, please call your pharmacy*   Lab Work: Your physician recommends that you return for lab work (fasting lipid).  If you have labs (blood work) drawn today and your tests are completely normal, you will receive your results only by: MyChart Message (if you have MyChart) OR A paper copy in the mail If you have any lab test that is abnormal or we need to change your treatment, we will call you to review the results.   Testing/Procedures: Our physician has recommended that you wear an  3 DAY ZIO-PATCH monitor. The Zio patch cardiac monitor continuously records heart rhythm data for up to 14 days, this is for patients being evaluated for multiple types heart rhythms. For the first 24 hours post application, please avoid getting the Zio monitor wet in the shower or by excessive sweating during exercise. After that, feel free to carry on with regular activities. Keep soaps and lotions away from the ZIO XT Patch.   Someone from our office will call to verify address and mail monitor.   Dr. Antoine Poche has ordered a CT coronary calcium score. This test is done at [address] [address]. This is $99 out of pocket.   Coronary CalciumScan A coronary calcium scan is an imaging test used to look for deposits of calcium and other fatty materials (plaques) in the inner lining of the blood vessels of the heart (coronary arteries). These deposits of calcium and plaques can partly clog and narrow the coronary arteries without producing any symptoms or warning signs. This puts a person at risk for a heart attack. This test can detect these deposits before symptoms develop. Tell a health care provider about: Any allergies you have. All medicines you are taking, including vitamins, herbs, eye drops, creams, and  over-the-counter medicines. Any problems you or family members have had with anesthetic medicines. Any blood disorders you have. Any surgeries you have had. Any medical conditions you have. Whether you are pregnant or may be pregnant. What are the risks? Generally, this is a safe procedure. However, problems may occur, including: Harm to a pregnant woman and her unborn baby. This test involves the use of radiation. Radiation exposure can be dangerous to a pregnant woman and her unborn baby. If you are pregnant, you generally should not have this procedure done. Slight increase in the risk of cancer. This is because of the radiation involved in the test. What happens before the procedure? No preparation is needed for this procedure. What happens during the procedure? You will undress and remove any jewelry around your neck or chest. You will put on a hospital gown. Sticky electrodes will be placed on your chest. The electrodes will be connected to an electrocardiogram (ECG) machine to record a tracing of the electrical activity of your heart. A CT scanner will take pictures of your heart. During this time, you will be asked to lie still and hold your breath for 2-3 seconds while a picture of your heart is being taken. The procedure may vary among health care providers and hospitals. What happens after the procedure? You can get dressed. You can return to your normal activities. It is up to you to get the results of your test. Ask your health care provider, or the department that is doing the test, when your results will be  ready. SummaryD A coronary calcium scan is an imaging test used to look for deposits of calcium and other fatty materials (plaques) in the inner lining of the blood vessels of the heart (coronary arteries). Generally, this is a safe procedure. Tell your health care provider if you are pregnant or may be pregnant. No preparation is needed for this procedure. A CT scanner  will take pictures of your heart. You can return to your normal activities after the scan is done. This information is not intended to replace advice given to you by your health care provider. Make sure you discuss any questions you have with your health care provider. Document Released: 10/24/2007 Document Revised: 03/16/2016 Document Reviewed: 03/16/2016 Elsevier Interactive Patient Education  2017 [employer].     Follow-Up: At [facility], you and your health needs are our priority.  As part of our continuing mission to provide you with exceptional heart care, we have created designated Provider Care Teams.  These Care Teams include your primary Cardiologist (physician) and Advanced Practice Providers (APPs -  Physician Assistants and Nurse Practitioners) who all work together to provide you with the care you need, when you need it.  We recommend signing up for the patient portal called "MyChart".  Sign up information is provided on this After Visit Summary.  MyChart is used to connect with patients for Virtual Visits (Telemedicine).  Patients are able to view lab/test results, encounter notes, upcoming appointments, etc.  Non-urgent messages can be sent to your provider as well.   To learn more about what you can do with MyChart, go to [email or web address].    Your next appointment:   As needed  The format for your next appointment:   In Person  Provider:   Rollene Rotunda, MD   Christena Deem- Long Term Monitor Instructions  Your physician has requested you wear a ZIO patch monitor for 3 days.  This is a single patch monitor. [employer] supplies one patch monitor per enrollment. Additional stickers are not available. Please do not apply patch if you will be having a Nuclear Stress Test,  Echocardiogram, Cardiac CT, MRI, or Chest Xray during the period you would be wearing the  monitor. The patch cannot be worn during these tests. You cannot remove and re-apply the  ZIO XT patch  monitor.  Your ZIO patch monitor will be mailed 3 day USPS to your address on file. It may take 3-5 days  to receive your monitor after you have been enrolled.  Once you have received your monitor, please review the enclosed instructions. Your monitor  has already been registered assigning a specific monitor serial # to you.  Billing and Patient Assistance Program Information  We have supplied [employer] with any of your insurance information on file for billing purposes. [employer] offers a sliding scale Patient Assistance Program for patients that do not have  insurance, or whose insurance does not completely cover the cost of the ZIO monitor.  You must apply for the Patient Assistance Program to qualify for this discounted rate.  To apply, please call [employer] at [phone], select option 4, select option 2, ask to apply for  Patient Assistance Program. Meredeth Ide will ask your household income, and how many people  are in your household. They will quote your out-of-pocket cost based on that information.  [employer] will also be able to set up a 17-month, interest-free payment plan if needed.  Applying the monitor   Shave hair from upper left chest.  Hold abrader  disc by orange tab. Rub abrader in 40 strokes over the upper left chest as  indicated in your monitor instructions.  Clean area with 4 enclosed alcohol pads. Let dry.  Apply patch as indicated in monitor instructions. Patch will be placed under collarbone on left  side of chest with arrow pointing upward.  Rub patch adhesive wings for 2 minutes. Remove Lokken label marked "1". Remove the Juma  label marked "2". Rub patch adhesive wings for 2 additional minutes.  While looking in a mirror, press and release button in center of patch. A small green light will  flash 3-4 times. This will be your only indicator that the monitor has been turned on.  Do not shower for the first 24 hours. You may shower after the first 24 hours.  Press the  button if you feel a symptom. You will hear a small click. Record Date, Time and  Symptom in the Patient Logbook.  When you are ready to remove the patch, follow instructions on the last 2 pages of Patient  Logbook. Stick patch monitor onto the last page of Patient Logbook.  Place Patient Logbook in the blue and Radziewicz box. Use locking tab on box and tape box closed  securely. The blue and [place] box has prepaid postage on it. Please place it in the mailbox as  soon as possible. Your physician should have your test results approximately 7 days after the  monitor has been mailed back to [facility].  Call [facility] Customer Care at [phone] if you have questions regarding  your ZIO XT patch monitor. Call them immediately if you see an orange light blinking on your  monitor.  If your monitor falls off in less than 4 days, contact our Monitor department at [phone].  If your monitor becomes loose or falls off after 4 days call [employer] at [phone] for  suggestions on securing your monitor

## 2021-01-02 ENCOUNTER — Ambulatory Visit (INDEPENDENT_AMBULATORY_CARE_PROVIDER_SITE_OTHER)
Admission: RE | Admit: 2021-01-02 | Discharge: 2021-01-02 | Disposition: A | Discharge status: Home / Self Care | Payer: Self-pay | Source: Ambulatory Visit | Attending: Cardiovascular Disease | Admitting: Cardiovascular Disease

## 2021-01-02 ENCOUNTER — Other Ambulatory Visit: Payer: 59 | Admitting: *Deleted

## 2021-01-02 DIAGNOSIS — R072 Precordial pain: Secondary | ICD-10-CM

## 2021-01-02 LAB — LIPID PANEL
Chol/HDL Ratio: 5.3 ratio — ABNORMAL HIGH (ref 0.0–5.0)
Cholesterol, Total: 185 mg/dL (ref 100–199)
HDL: 35 mg/dL — ABNORMAL LOW (ref >39)
LDL Chol Calc (NIH): 127 mg/dL — ABNORMAL HIGH (ref 0–99)
Triglycerides: 129 mg/dL (ref 0–149)
VLDL Cholesterol Cal: 23 mg/dL (ref 5–40)

## 2021-01-05 DIAGNOSIS — R001 Bradycardia, unspecified: Secondary | ICD-10-CM | POA: Diagnosis not present

## 2021-01-09 ENCOUNTER — Telehealth: Payer: Self-pay | Admitting: *Deleted

## 2021-01-09 DIAGNOSIS — R931 Abnormal findings on diagnostic imaging of heart and coronary circulation: Secondary | ICD-10-CM

## 2021-01-09 NOTE — Telephone Encounter (Signed)
-----   Message from Rollene Rotunda, MD sent at 01/08/2021  3:28 PM EDT ----- Coronary calcium is elevated.  I would like to screen with a POET (Plain Old Exercise Treadmill).  I need to see him back in the office after this.  Call Mr. Kadrmas with the results and send results to Johny Blamer, MD

## 2021-01-10 ENCOUNTER — Telehealth (HOSPITAL_COMMUNITY): Payer: Self-pay | Admitting: *Deleted

## 2021-01-10 NOTE — Telephone Encounter (Signed)
Close encounter 

## 2021-01-14 ENCOUNTER — Other Ambulatory Visit: Payer: Self-pay

## 2021-01-14 ENCOUNTER — Ambulatory Visit (HOSPITAL_COMMUNITY)
Admission: RE | Admit: 2021-01-14 | Discharge: 2021-01-14 | Disposition: A | Discharge status: Home / Self Care | Payer: 59 | Source: Ambulatory Visit | Attending: Cardiovascular Disease | Admitting: Cardiovascular Disease

## 2021-01-14 DIAGNOSIS — R931 Abnormal findings on diagnostic imaging of heart and coronary circulation: Secondary | ICD-10-CM

## 2021-01-14 LAB — EXERCISE TOLERANCE TEST
Estimated workload: 10.4
Exercise duration (min): 9 min
Exercise duration (sec): 1 s
MPHR: 179 {beats}/min
Peak HR: 164 {beats}/min
Percent HR: 91 %
Rest HR: 65 {beats}/min
ST Depression (mm): 0 mm

## 2021-01-20 NOTE — Progress Notes (Signed)
Cardiology Office Note   Date:  01/21/2021   ID:  Gregory Gutierrez, Gregory Gutierrez 03/09/1981, MRN [id number]  PCP:  Gregory Blamer, MD   Cardiologist:   Gregory Rotunda, MD Referring:  Gregory Blamer, MD  Chief Complaint  Patient presents with   Elevated Coronary Calcium        History of Present Illness: Gregory Gutierrez is a 40 y.o. male who presents for was referred by Gregory Blamer, MD    He was in the emergency room on 12/26/2020 with chest discomfort.  There was no objective evidence of ischemia.  Troponins were normal.  EKG was nonacute.  Chest x-ray demonstrated no acute disease. After my first visit with him I sent him for a calcium score which was 236.   He had a negative POET (Plain Old Exercise Treadmill).    Since I last saw him he has done well.  He exercises aerobically 5-6 times per week and he feels great when he is doing that.  He has no chest pressure, neck or arm discomfort.  He feels some stress in his chest when he is anxious especially having to deal with his mother's estate.  She died in Dec 15, 2022.  He denies any shortness of breath, PND or orthopnea.  He has had about a 10 pound weight loss with some intermittent fasting.   Past Medical History:  Diagnosis Date   GERD (gastroesophageal reflux disease)    Sleep apnea     Past Surgical History:  Procedure Laterality Date   TONSILLECTOMY       Current Outpatient Medications  Medication Sig Dispense Refill   KRILL OIL PO Take by mouth.     omeprazole (PRILOSEC) 40 MG capsule Take 40 mg by mouth 2 (two) times daily.     rosuvastatin (CRESTOR) 20 MG tablet Take 1 tablet (20 mg total) by mouth daily. 90 tablet 3   No current facility-administered medications for this visit.    Allergies:   Other    ROS:  Please see the history of present illness.   Otherwise, review of systems are positive for none.   All other systems are reviewed and negative.    PHYSICAL EXAM: VS:  BP 116/74   Pulse 66   Ht 6\' 1"  (1.854 m)    Wt (!) 357 lb 9.6 oz (162.2 kg)   SpO2 97%   BMI 47.18 kg/m  , BMI Body mass index is 47.18 kg/m. GENERAL:  Well appearing NECK:  No jugular venous distention, waveform within normal limits, carotid upstroke brisk and symmetric, no bruits, no thyromegaly LUNGS:  Clear to auscultation bilaterally CHEST:  Unremarkable HEART:  PMI not displaced or sustained,S1 and S2 within normal limits, no S3, no S4, no clicks, no rubs, no murmurs ABD:  Flat, positive bowel sounds normal in frequency in pitch, no bruits, no rebound, no guarding, no midline pulsatile mass, no hepatomegaly, no splenomegaly EXT:  2 plus pulses throughout, no edema, no cyanosis no clubbing   EKG:  EKG is not ordered today.    Recent Labs: 11/28/2020: ALT 27 12/26/2020: BUN 15; Creatinine, Ser 0.72; Hemoglobin 14.9; Platelets 215; Potassium 3.7; Sodium 138    Lipid Panel    Component Value Date/Time   CHOL 185 01/02/2021 0856   TRIG 129 01/02/2021 0856   HDL 35 (L) 01/02/2021 0856   CHOLHDL 5.3 (H) 01/02/2021 0856   LDLCALC 127 (H) 01/02/2021 0856      Wt Readings from Last 3 Encounters:  01/21/21 (!) 357 lb 9.6 oz (162.2 kg)  01/01/21 (!) 361 lb (163.7 kg)  12/26/20 (!) 364 lb (165.1 kg)      Other studies Reviewed: Additional studies/ records that were reviewed today include: Labs Review of the above records demonstrates:  Please see elsewhere in the note.     ASSESSMENT AND PLAN:  ELEVATED CORONARY CALCIUM   We had a long discussion about this.  He had a negative treadmill test.  We are going to participate aggressively in risk reduction.   BRADYCARDIA:  He had no significant arrhythmias on a 3 day monitor.  He is having his sleep apnea treated.  No change in therapy.    DYSLIPIDEMIA:  LDL was 127 with an HDL of 35.  I am going to start him on Crestor 20 mg daily with a goal LDL less than 70.  He will get of fasting lipid profile in about 10 weeks.   Current medicines are reviewed at length with  the patient today.  The patient does not have concerns regarding medicines.  The following changes have been made:  None  Labs/ tests ordered today include: None  Orders Placed This Encounter  Procedures   Lipid panel   Comprehensive metabolic panel   Ambulatory referral to Family Practice       Disposition:   FU with me one year.    Signed, Gregory Rotunda, MD  01/21/2021 4:52 PM    [facility]

## 2021-01-21 ENCOUNTER — Encounter: Payer: Self-pay | Admitting: Cardiology

## 2021-01-21 ENCOUNTER — Ambulatory Visit: Payer: 59 | Admitting: Cardiology

## 2021-01-21 ENCOUNTER — Other Ambulatory Visit: Payer: Self-pay

## 2021-01-21 VITALS — BP 116/74 | HR 66 | Ht 73.0 in | Wt 357.6 lb

## 2021-01-21 DIAGNOSIS — E785 Hyperlipidemia, unspecified: Secondary | ICD-10-CM | POA: Diagnosis not present

## 2021-01-21 DIAGNOSIS — Z5181 Encounter for therapeutic drug level monitoring: Secondary | ICD-10-CM | POA: Diagnosis not present

## 2021-01-21 DIAGNOSIS — R931 Abnormal findings on diagnostic imaging of heart and coronary circulation: Secondary | ICD-10-CM | POA: Diagnosis not present

## 2021-01-21 MED ORDER — ROSUVASTATIN CALCIUM 20 MG PO TABS: 20.0000 mg | ORAL_TABLET | Freq: Every day | ORAL | 3 refills | Status: DC

## 2021-01-21 NOTE — Patient Instructions (Addendum)
Medication Instructions:  START ROSUVASTATIN 20 MG DAILY   *If you need a refill on your cardiac medications before your next appointment, please call your pharmacy*  Lab Work: FASTING LP/CMET IN ABOUT 10 WEEKS   If you have labs (blood work) drawn today and your tests are completely normal, you will receive your results only by: MyChart Message (if you have MyChart) OR A paper copy in the mail If you have any lab test that is abnormal or we need to change your treatment, we will call you to review the results.  Testing/Procedures: NONE   Follow-Up: At [facility], you and your health needs are our priority.  As part of our continuing mission to provide you with exceptional heart care, we have created designated Provider Care Teams.  These Care Teams include your primary Cardiologist (physician) and Advanced Practice Providers (APPs -  Physician Assistants and Nurse Practitioners) who all work together to provide you with the care you need, when you need it.  We recommend signing up for the patient portal called "MyChart".  Sign up information is provided on this After Visit Summary.  MyChart is used to connect with patients for Virtual Visits (Telemedicine).  Patients are able to view lab/test results, encounter notes, upcoming appointments, etc.  Non-urgent messages can be sent to your provider as well.   To learn more about what you can do with MyChart, go to [email or web address].    Your next appointment:   12 month(s)  The format for your next appointment:   In Person  Provider:   You may see Rollene Rotunda, MD or one of the following Advanced Practice Providers on your designated Care Team:   Theodore Demark, PA-C Juanda Crumble, PA-C Joni Reining, DNP, ANP  You have been referred to   Where: [facility] Address: [address] [place] [address] Phone: [phone]  IF YOU DO NOT HEAR FROM THE OFFICE IN 2 WEEKS  YOU CAN CALL THEM  DIRECTLY AT THE NUMBER ABOVE

## 2021-02-11 ENCOUNTER — Ambulatory Visit: Payer: BLUE CROSS/BLUE SHIELD | Admitting: Cardiovascular Disease

## 2021-09-19 ENCOUNTER — Encounter: Payer: Self-pay | Admitting: Gastroenterology

## 2021-09-25 ENCOUNTER — Telehealth: Payer: Self-pay | Admitting: Gastroenterology

## 2021-09-25 NOTE — Telephone Encounter (Signed)
Good Morning Dr. Havery Moros,    We have received records for patient to be reviewed. Patient is requesting to establish care with you. Patient is needing to be seen for abdominal pain  and hernia. I will be sending records for review, will you place review and advise on scheduling?  Thank you.

## 2021-10-17 ENCOUNTER — Ambulatory Visit: Payer: 59 | Admitting: Gastroenterology

## 2021-10-28 ENCOUNTER — Encounter: Payer: Self-pay | Admitting: Gastroenterology

## 2021-10-28 ENCOUNTER — Ambulatory Visit: Payer: 59 | Admitting: Gastroenterology

## 2021-10-28 DIAGNOSIS — K5900 Constipation, unspecified: Secondary | ICD-10-CM

## 2021-10-28 DIAGNOSIS — R140 Abdominal distension (gaseous): Secondary | ICD-10-CM

## 2021-10-28 DIAGNOSIS — R1012 Left upper quadrant pain: Secondary | ICD-10-CM

## 2021-10-28 MED ORDER — LINACLOTIDE 145 MCG PO CAPS: ORAL_CAPSULE | ORAL | 0 refills | Status: DC

## 2021-10-28 MED ORDER — DICYCLOMINE HCL 10 MG PO CAPS: ORAL_CAPSULE | ORAL | 1 refills | Status: AC

## 2021-10-28 NOTE — Progress Notes (Signed)
HPI :  42 year old male with a history of obesity, sleep apnea, GERD, self-referred for further evaluation of ongoing abdominal pain.  He has been evaluated by [facility] in the past for some of these issues.  He states about 7 to 8 months ago he developed some pain in his upper abdomen.  This had been persistent and there most of the time, described as a dull ache.  He felt at that time he could not eat very well, felt uncomfortable after eating food.  He had an EGD with Dr. Bosie Clos of [facility] which did not show any concerning findings in October.  He states over time symptoms persisted. About 3 months ago he states things got worse for him.  He has a friend who is a physical therapist and "pulled on his diaphragm", describes sort of a manipulation of his left upper quadrant which resulted in him having several large bowel movements.  He states he felt better after having that intervention done.  The pain has not been as bad but he persistently has had some discomfort now in his left upper quadrant that radiates to his left flank under his ribs.  He states that lying on his left side with his arm raised can make it better.  He denies any prandial relationship to this at all.  Eating does not make it better or worse.  Having a bowel movement does not make it better or worse.  He has 1 bowel movement roughly every day to every other day.  He has "rabbit pellet stools", hard stools that are hard to get out.  He was on MiraLAX in the past which did not really help him.  Even taking MiraLAX twice daily dosing did not provide much benefit. he has a history of anal fissure but he does not think bother him recently.  He states at baseline the pain is rated between 2-3 out of 10.  He feels that they are pretty much most of the time, does not really go away, but can have fluctuations in intensity in regards to how much it bothers him.  Does not radiate anywhere else.  He does do some heavy lifting at times but he does  not think it reproduces his symptoms.  He is concerned about a "strangulated hernia" or other internal process causing an issue.  He denies any history of abdominal surgeries.  No family history of colon cancer.  His father did have Crohn's disease and his father had duodenal cancer.  He otherwise has generalized bloating that bothers him.  He has been using some probiotics to help with this but has not provided much benefit.  For his reflux he previously was on omeprazole but had then switched to Protonix.  He states his GERD is controlled on 40 mg daily.  He denies any dysphagia.  He has some occasional belching.  No nausea or vomiting.  His body mass index is currently listed as 50.49.  He states in recent years he had lost some weight about 40 pounds intentionally with dieting, but has since regained some of this weight.  He has never had a colonoscopy.  He has had a work-up as outlined below.  He had a CT scan last July but he states that was due to to another issue at the time and he was not having this left upper sided pain at the time.  Symptoms have definitely changed since that last exam was done.   Prior workup: CT abdomen /  pelvis with contrast 11/29/20: FINDINGS: Lower chest: No acute abnormality.   Hepatobiliary: No focal liver abnormality is seen. No gallstones, gallbladder wall thickening, or biliary dilatation.   Pancreas: Unremarkable. No pancreatic ductal dilatation or surrounding inflammatory changes.   Spleen: Normal in size without focal abnormality.   Adrenals/Urinary Tract: Adrenal glands are unremarkable. Kidneys are normal, without renal calculi, focal lesion, or hydronephrosis. Bladder is unremarkable.   Stomach/Bowel: Stomach is within normal limits. Appendix appears normal. No evidence of bowel wall thickening, distention, or inflammatory changes. 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   Vascular/Lymphatic: No significant  vascular findings are present. No enlarged abdominal or pelvic lymph nodes.   Reproductive: Prostate is unremarkable.   Other: No abdominal wall hernia or abnormality. No abdominopelvic ascites. Fat containing left inguinal hernia   Musculoskeletal: No acute or significant osseous findings.   IMPRESSION: 1. No CT evidence for acute intra-abdominal or pelvic abnormality. 2. 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 Bosie Clos - gastritis - negative for H pylori, small HH, normal esophagus / duodenum otherwise   Labs: 02/06/21: AP 50, ALT 28, AST 18, T bil 0.5 BUN 15, Cr 0.89 TSH 2.90 Hgb 14.7, HCT 41.5, plt 242, MCV 90, WBC 7.7   Past Medical History:  Diagnosis Date   ADD (attention deficit disorder)    Anxiety    Depression    Fracture, ankle    left   GERD (gastroesophageal reflux disease)    HTN (hypertension)    Hyperlipemia    IBS (irritable bowel syndrome)    Obesity    Sleep apnea    CPAP     Past Surgical History:  Procedure Laterality Date   ANKLE FRACTURE SURGERY Left    ORIF   CIRCUMCISION     second   ESOPHAGOGASTRODUODENOSCOPY  2022   TONSILLECTOMY     WISDOM TOOTH EXTRACTION     AGE 67   Family History  Problem Relation Age of Onset   Pneumonia Mother        Covid   Rheum arthritis Mother    Hyperlipidemia Mother    Cancer Father        DUODENUM   Crohn's disease Father    Hypertension Father    CVA Father    Stroke Maternal Grandfather    Crohn's disease Paternal Grandmother    Heart attack Paternal Grandfather    Social History   Tobacco Use   Smoking status: Former    Types: Cigarettes    Quit date: 2003    Years since quitting: 20.4   Smokeless tobacco: Never   Tobacco comments:    Smoked for 6 months in 20's  Vaping Use   Vaping Use: Never used  Substance Use Topics   Alcohol use: Yes    Comment: social   Drug use: No   Current Outpatient Medications  Medication Sig  Dispense Refill   Cholecalciferol (VITAMIN D3 PO) Take 1 tablet by mouth daily.     pantoprazole (PROTONIX) 40 MG tablet Take 40 mg by mouth 2 (two) times daily.     Probiotic Product (PROBIOTIC PO) Take 1 capsule by mouth daily.     rosuvastatin (CRESTOR) 20 MG tablet Take 1 tablet (20 mg total) by mouth daily. 90 tablet 3   No current facility-administered medications for this visit.   No Active Allergies    Review of Systems: All systems reviewed and negative except where noted  in HPI.   See labs in HPI  Physical Exam: BP 116/64 (BP Location: Left Arm, Patient Position: Sitting, Cuff Size: Normal)   Pulse 68   Ht 5' 11.5" (1.816 m) Comment: height measured wihtout shoes  Wt (!) 367 lb 2 oz (166.5 kg)   BMI 50.49 kg/m  Constitutional: Pleasant,well-developed, male in no acute distress. HEENT: Normocephalic and atraumatic. Conjunctivae are normal. No scleral icterus. Neck supple.  Cardiovascular: Normal rate, regular rhythm.  Pulmonary/chest: Effort normal and breath sounds normal. No wheezing, rales or rhonchi. Abdominal: Soft, nondistended, mild upper quadrant TTP with negtive [place]. There are no masses palpable. No hepatomegaly. Extremities: no edema Lymphadenopathy: No cervical adenopathy noted. Neurological: Alert and oriented to person place and time. Skin: Skin is warm and dry. No rashes noted. Psychiatric: Normal mood and affect. Behavior is normal.   ASSESSMENT AND PLAN: 41 year old male here for new patient assessment of the following:  Left upper quadrant pain Constipation Bloating  Persistent left upper quadrant pain that radiates under the left rib left mid axillary line.  He also has some baseline constipation and bloating but no relationship with his bowels to the pain and no prandial relationship.  EGD negative.  Discussed differential diagnosis.  He does have some positional component to this, his symptoms sound more musculoskeletal / abdominal wall in  etiology to me, perhaps nerve impingement.  That being said he has had a clear change in symptoms over the past several months and he is concerned about a more internal process causing this. We discussed ways to evaluate this, I offered him a CT scan abdomen pelvis to further evaluate and make sure nothing concerning causing this, as his prior exam was done months prior to his new pain starting.  If this is negative I would assume this is most likely musculoskeletal.  In the interim we discussed ways to manage some of the symptoms.  He is having a lot of bloating and probiotics are not helping, would recommend he stop the probiotics.  He did not respond to well to higher dose MiraLAX.  For his constipation we will give him some samples of Linzess 145 mcg, he can take 1 or 2 tabs per day, if this works for him he can ask [place] for prescription.  I will give him an empiric trial of some Bentyl otherwise to treat for bowel spasm and see if that helps his abdomen at all.  He can use that as needed.  Further recommendations pending the results of his course and the CT scan.  If symptoms persist without clear cause can consider colonoscopy however I think this may be low yield for the symptoms especially if the CT scan is negative. He agrees with the plan as outlined.  Plan: - CT scan abdomen / pelvis with contrast  - stop probiotics - start linzess - samples given - - 1 or 2 tabs per day - trial of Bentyl 10mg  - 1-2 tabs every 8 hours PRN #30 RF1 - further recommendations pending CT results and his course  , MD [facility]  CC: Harlin Rain, MD

## 2021-10-28 NOTE — Patient Instructions (Signed)
If you are age 42 or younger, your body mass index should be between 19-25. Your Body mass index is 50.49 kg/m. If this is out of the aformentioned range listed, please consider follow up with your Primary Care Provider.  ________________________________________________________  The [facility] providers would like to encourage you to use [facility] to communicate with providers for non-urgent requests or questions.  Due to long hold times on the telephone, sending your provider a message by [facility] may be a faster and more efficient way to get a response.  Please allow 48 business hours for a response.  Please remember that this is for non-urgent requests.  _______________________________________________________  Dennis Bast have been scheduled for a CT scan of the abdomen and pelvis at [facility], 1st floor Radiology. You are scheduled on _______  at ______. You should arrive 15 minutes prior to your appointment time for registration.  Please pick up 2 bottles of contrast from [facility] at least 3 days prior to your scan. The solution may taste better if refrigerated, but do NOT add ice or any other liquid to this solution. Shake well before drinking.   Please follow the written instructions below on the day of your exam:   1) Do not eat anything after _____ (4 hours prior to your test)   2) Drink 1 bottle of contrast @ _____ (2 hours prior to your exam)  Remember to shake well before drinking and do NOT pour over ice.     Drink 1 bottle of contrast @ _____ (1 hour prior to your exam)   You may take any medications as prescribed with a small amount of water, if necessary. If you take any of the following medications: METFORMIN, GLUCOPHAGE, GLUCOVANCE, AVANDAMET, RIOMET, FORTAMET, [place] MET, JANUMET, GLUMETZA or METAGLIP, you MAY be asked to HOLD this medication 48 hours AFTER the exam.   The purpose of you drinking the oral contrast is to aid in the visualization of your intestinal tract. The  contrast solution may cause some diarrhea. Depending on your individual set of symptoms, you may also receive an intravenous injection of x-ray contrast/dye. Plan on being at [facility] for 45 minutes or longer, depending on the type of exam you are having performed.   If you have any questions regarding your exam or if you need to reschedule, you may call [facility] at [phone] between the hours of 8:00 am and 5:00 pm, Monday-Friday.   Due to recent changes in healthcare laws, you may see the results of your imaging and laboratory studies on MyChart before your provider has had a chance to review them.  We understand that in some cases there may be results that are confusing or concerning to you. Not all laboratory results come back in the same time frame and the provider may be waiting for multiple results in order to interpret others.  Please give [place] 48 hours in order for your provider to thoroughly review all the results before contacting the office for clarification of your results.   DISCONTINUE: Probiotics  We have sent the following medications to your pharmacy for you to pick up at your convenience:  START: Linzess 145 mcg 1 capsule daily, can increase to 2 capsules daily if needed.  (Samples given.)  START: Bentyl 97m 1 to 2 capsules every 8 hours as needed.  Thank you for entrusting me with your care and [facility]  Dr AHavery Moros

## 2021-11-04 ENCOUNTER — Ambulatory Visit (HOSPITAL_COMMUNITY)
Admission: RE | Admit: 2021-11-04 | Discharge: 2021-11-04 | Disposition: A | Discharge status: Home / Self Care | Payer: 59 | Source: Ambulatory Visit | Attending: Gastroenterology | Admitting: Gastroenterology

## 2021-11-04 DIAGNOSIS — R140 Abdominal distension (gaseous): Secondary | ICD-10-CM | POA: Diagnosis present

## 2021-11-04 DIAGNOSIS — R1012 Left upper quadrant pain: Secondary | ICD-10-CM | POA: Diagnosis present

## 2021-11-04 DIAGNOSIS — K5900 Constipation, unspecified: Secondary | ICD-10-CM

## 2021-11-04 MED ORDER — SODIUM CHLORIDE (PF) 0.9 % IJ SOLN
INTRAMUSCULAR | Status: AC
  Filled 2021-11-04: qty 50

## 2021-11-04 MED ORDER — IOHEXOL 300 MG/ML  SOLN
100.0000 mL | Freq: Once | INTRAMUSCULAR | Status: AC | PRN
  Administered 2021-11-04: 100 mL via INTRAVENOUS

## 2021-11-07 ENCOUNTER — Encounter: Payer: Self-pay | Admitting: Gastroenterology

## 2021-11-07 ENCOUNTER — Ambulatory Visit (HOSPITAL_COMMUNITY): Payer: 59

## 2021-11-07 MED ORDER — LINACLOTIDE 145 MCG PO CAPS: ORAL_CAPSULE | ORAL | 2 refills | Status: AC

## 2022-01-17 ENCOUNTER — Other Ambulatory Visit: Payer: Self-pay | Admitting: Cardiology

## 2022-02-12 ENCOUNTER — Ambulatory Visit: Payer: 59 | Admitting: Sports Medicine

## 2022-02-12 VITALS — BP 129/78 | Ht 71.0 in | Wt 358.0 lb

## 2022-02-12 DIAGNOSIS — R0789 Other chest pain: Secondary | ICD-10-CM | POA: Diagnosis not present

## 2022-02-12 NOTE — Progress Notes (Signed)
   Subjective:    Patient ID: Gregory Gutierrez, male    DOB: Mar 28, 1981, 41 y.o.   MRN: [id number]  HPI chief complaint: Left-sided chest and upper abdominal pain  Patient is a very pleasant 41 year old male that comes in today complaining of chronic left-sided chest and upper abdominal pain.  He has been thoroughly evaluated by both cardiology and gastroenterology.  CT scans of his abdomen did not reveal any cause for his pain.  He describes a generalized discomfort that actually improves with activity.  He also notices that changes in his diet such as increasing his fiber intake helps with his pain.  Increased sugar intake seems to make his symptoms worse.  He describes a previous bulge in the left upper quadrant which was treated by physical therapist about 6 to 7 months ago.  He does have a history of a previous pectoralis muscle tear treated nonoperatively.  Remote history of a left shoulder dislocation which was also treated nonoperatively.    Review of Systems As above    Objective:   Physical Exam  Well-developed, well-nourished.  No acute distress  Chest: Inspection reveals no abnormalities.  Palpation reveals no defects in the pectoralis major muscle or tendon.  Good strength. Abdominal exam: No palpable masses in the left upper quadrant.      Assessment & Plan:   Chronic left-sided chest and upper abdominal pain  It is possible that some of his left-sided chest discomfort is related to his previous pectoralis muscle injury but other than activity modification there is not much that can be done for this.  I do believe that he gets intermittent spasm of his oblique muscles just underneath the left side of his rib cage.  Since his discomfort seems to be diet related I encouraged him to continue with his healthy diet habits.  Previous chest x-ray and abdominal CT are reassuring.  I do not believe further imaging is needed at this time.  Follow-up as needed.  This note was dictated  using Dragon naturally speaking software and may contain errors in syntax, spelling, or content which have not been identified prior to signing this note.

## 2022-03-11 ENCOUNTER — Institutional Professional Consult (permissible substitution): Payer: 59 | Admitting: Neurology

## 2022-04-01 ENCOUNTER — Encounter: Payer: Self-pay | Admitting: *Deleted

## 2022-04-06 ENCOUNTER — Ambulatory Visit: Payer: 59 | Admitting: Neurology

## 2022-04-06 ENCOUNTER — Encounter: Payer: Self-pay | Admitting: Neurology

## 2022-04-06 VITALS — BP 114/72 | HR 61 | Ht 72.0 in | Wt 366.4 lb

## 2022-04-06 DIAGNOSIS — G4733 Obstructive sleep apnea (adult) (pediatric): Secondary | ICD-10-CM

## 2022-04-06 DIAGNOSIS — R634 Abnormal weight loss: Secondary | ICD-10-CM

## 2022-04-06 DIAGNOSIS — K589 Irritable bowel syndrome without diarrhea: Secondary | ICD-10-CM | POA: Insufficient documentation

## 2022-04-06 NOTE — Progress Notes (Addendum)
Subjective:    Patient ID: Gregory Gutierrez is a 41 y.o. male.  HPI    [name], MD, PhD [facility] [address] [address] [place], [place] [address]  Dear Dr. Renne Crigler,  I saw your patient, Gregory Gutierrez, upon your kind request in my sleep clinic today for initial consultation of his sleep disorder, in particular, evaluation of his prior diagnosis of obstructive sleep apnea.  The patient is unaccompanied today.  As you know, Gregory Gutierrez is a 41 year old male with an underlying medical history of hypertension, hyperlipidemia, irritable bowel syndrome, depression, anxiety, ADD, reflux disease, and morbid obesity with a BMI of over 45, who was previously diagnosed with obstructive sleep apnea and placed on PAP therapy.  I reviewed your office note from 02/05/2022.  Prior sleep study results are not available for my review today.  He reports that testing through [facility] sleep medicine about 2 years ago and he had a home sleep test at the time.  His DME provider is [facility].  He is currently on an AutoPap machine and I was able to review his download.  In the past 30 days between 03/07/2022 and 04/05/2022 he used his machine every night with percent use days greater than 4 hours at 100%, indicating superb compliance with an average usage of 7 hours and 45 minutes, residual AHI at goal at 1.3/h, 95th percentile of pressure at 10.4 cm with a range of 6 to 16 cm with EPR of 3.  Leak on the low side with the 95th percentile of 0.7 L/min.  He has been working on weight loss.  Since his diagnosis he has lost about 70 pounds.  He reports that he was diagnosed with severe sleep apnea at the time.  He had a tonsillectomy at age 42.  He has no family history of sleep apnea. The patient is single, lives alone, no children, no pets in the household.  His Epworth sleepiness score is 2 out of 24, fatigue severity score is 18 out of 63.  He quit his job in August 2023.  Bedtime is generally  between 10 and 11 and rise time between 6 and 8.  He denies recurrent morning headaches and no nightly nocturia.  He takes magnesium and melatonin at night for sleep.  He drinks caffeine in the form of coffee, 1 cup in the morning, alcohol very occasionally or rarely, about once a month.  He is a non-smoker.  He would like to get reevaluated for his sleep apnea as he would like to come off his CPAP machine if possible.  He is not interested in any surgical options, as we did talk about inspire.  Addendum, 04/22/2022: I reviewed the patient's home sleep test report.  He had a home sleep test through [facility] physicians on 08/05/2020 showing an AHI of 38.1/h, O2 nadir 73%.  His Past Medical History Is Significant For: Past Medical History:  Diagnosis Date   ADD (attention deficit disorder)    Anxiety    Depression    Fracture, ankle    left   GERD (gastroesophageal reflux disease)    HTN (hypertension)    Hyperlipemia    IBS (irritable bowel syndrome)    Obesity    Sleep apnea    CPAP    His Past Surgical History Is Significant For: Past Surgical History:  Procedure Laterality Date   ANKLE FRACTURE SURGERY Left    ORIF   CIRCUMCISION     second  ESOPHAGOGASTRODUODENOSCOPY  2022   TONSILLECTOMY     WISDOM TOOTH EXTRACTION     AGE 58    His Family History Is Significant For: Family History  Problem Relation Age of Onset   Pneumonia Mother        Covid   Rheum arthritis Mother    Hyperlipidemia Mother    Cancer Father        DUODENUM   Crohn's disease Father    Hypertension Father    CVA Father    Stroke Maternal Grandfather    Crohn's disease Paternal Grandmother    Heart attack Paternal Grandfather     His Social History Is Significant For: Social History   Socioeconomic History   Marital status: Single    Spouse name: Not on file   Number of children: 0   Years of education: Not on file   Highest education level: Not on file  Occupational History   Occupation:  Public Relations/ account manager/ commuication analyst  Tobacco Use   Smoking status: Former    Types: Cigarettes    Quit date: 2003    Years since quitting: 20.9   Smokeless tobacco: Never   Tobacco comments:    Smoked for 6 months in 20's  Vaping Use   Vaping Use: Never used  Substance and Sexual Activity   Alcohol use: Yes    Comment: social   Drug use: No   Sexual activity: Yes  Other Topics Concern   Not on file  Social History Narrative   Right Handed   Live By himself   1 cup of Coffee per Day   Social Determinants of Health   Financial Resource Strain: Not on file  Food Insecurity: Not on file  Transportation Needs: Not on file  Physical Activity: Not on file  Stress: Not on file  Social Connections: Not on file    His Allergies Are:  No Active Allergies:   Her Current Medications Are:  Outpatient Encounter Medications as of 04/06/2022  Medication Sig   Ascorbic Acid (VITAMIN C PO) Take by mouth.   Cholecalciferol (VITAMIN D3 PO) Take 1 tablet by mouth daily.   pantoprazole (PROTONIX) 40 MG tablet Take 40 mg by mouth 2 (two) times daily.   rosuvastatin (CRESTOR) 20 MG tablet TAKE 1 TABLET BY MOUTH EVERY DAY   dicyclomine (BENTYL) 10 MG capsule Take 1 to 2 capsules every 8 hours as needed. (Patient not taking: Reported on 04/06/2022)   linaclotide (LINZESS) 145 MCG CAPS capsule Take 1 capsule daily before breakfast.  Can increase to 2 capsules if needed.   Probiotic Product (PROBIOTIC PO) Take 1 capsule by mouth daily.   No facility-administered encounter medications on file as of 04/06/2022.  :   Review of Systems:  Out of a complete 14 point review of systems, all are reviewed and negative with the exception of these symptoms as listed below:  Review of Systems  Neurological:        Pt is here Sleep Consult. Pt states that he is here to know what he needs to do to to get off CPAP Machine. Pt states no headaches. Pt states no fatigue.  ESS:2 FSS:18     Objective:  Neurological Exam  Physical Exam Physical Examination:   Vitals:   04/06/22 1130  BP: 114/72  Pulse: 61    Generl Examination: The patient is a very pleasant 41 y.o. male in no acute distress. He appears well-developed and well-nourished and well groomed.  HEENT: Normocephalic, atraumatic, pupils are equal, round and reactive to light, extraocular tracking is good without limitation to gaze excursion or nystagmus noted. Hearing is grossly intact. Face is symmetric with normal facial animation. Speech is clear with no dysarthria noted. There is no hypophonia. There is no lip, neck/head, jaw or voice tremor. Neck is supple with full range of passive and active motion. There are no carotid bruits on auscultation. Oropharynx exam reveals: mild mouth dryness, adequate dental hygiene and moderate airway crowding, due to small airway entry and redundant soft palate.  Questionable residual tonsillar tissue on the right.  He has a very minimal overbite, Mallampati class III, neck circumference of 19-3/8 inches.  Tongue protrudes centrally and palate elevates symmetrically.  Chest: Clear to auscultation without wheezing, rhonchi or crackles noted.  Heart: S1+S2+0, regular and normal without murmurs, rubs or gallops noted.   Abdomen: Soft, non-tender and non-distended.  Extremities: There is no pitting edema in the distal lower extremities bilaterally.   Skin: Warm and dry without trophic changes noted.   Musculoskeletal: exam reveals no obvious joint deformities.   Neurologically:  Mental status: The patient is awake, alert and oriented in all 4 spheres. His immediate and remote memory, attention, language skills and fund of knowledge are appropriate. There is no evidence of aphasia, agnosia, apraxia or anomia. Speech is clear with normal prosody and enunciation. Thought process is linear. Mood is normal and affect is normal.  Cranial nerves II - XII are as described above  under HEENT exam.  Motor exam: Normal bulk, strength and tone is noted. There is no obvious action or resting tremor.  Fine motor skills and coordination: grossly intact.  Cerebellar testing: No dysmetria or intention tremor. There is no truncal or gait ataxia.  Sensory exam: intact to light touch in the upper and lower extremities.  Gait, station and balance: He stands easily. No veering to one side is noted. No leaning to one side is noted. Posture is age-appropriate and stance is narrow based. Gait shows normal stride length and normal pace. No problems turning are noted.   Assessment and Plan:  In summary, DERICO MCCREA is a very pleasant 41 y.o.-year old male with with an underlying medical history of hypertension, hyperlipidemia, irritable bowel syndrome, depression, anxiety, ADD, reflux disease, and morbid obesity with a BMI of over 32, who presents for evaluation of his obstructive sleep apnea.  He was diagnosed some 2 years ago via home sleep testing and it at different facility.  He reports that his home sleep test indicated severe sleep apnea at the time.  He has been working on weight loss and has lost about 70 pounds since his original diagnosis.  He is fully compliant with his AutoPap but would like to re-evaluate his sleep apnea at this time.  He would ideally like to come off of his AutoPap machine if possible.  We talked about the sleep apnea diagnosis, its prognosis and treatment options.  He would not be interested in any surgical options.  He continues to work on weight loss.  He may or may not be a candidate for an oral appliance.  We will do sleep testing for reassessment.  I explained the differences between a laboratory attended sleep study versus home sleep testing.  He would be willing to do a laboratory study at this time.   I also explained the risks and ramifications of untreated moderate to severe OSA, especially with respect to developing cardiovascular disease down the road,  including congestive heart failure (CHF), difficult to treat hypertension, cardiac arrhythmias (particularly A-fib), neurovascular complications including TIA, stroke and dementia. Even type 2 diabetes has, in part, been linked to untreated OSA. Symptoms of untreated OSA may include (but may not be limited to) daytime sleepiness, nocturia (i.e. frequent nighttime urination), memory problems, mood irritability and suboptimally controlled or worsening mood disorder such as depression and/or anxiety, lack of energy, lack of motivation, physical discomfort, as well as recurrent headaches, especially morning or nocturnal headaches. We talked about the importance of maintaining a healthy lifestyle and striving for healthy weight.  He would be willing to continue with positive airway pressure treatment if the need arises.  We will reconvene after testing.  He is commended for his treatment  adherence. We will keep him posted as to the test results by phone call and/or MyChart messaging where possible.  We will plan to follow-up in sleep clinic accordingly as well.  I answered all his questions today and the patient was in agreement.   I encouraged him to call with any interim questions, concerns, problems or updates or email [place] through [facility].  Generally speaking, sleep test authorizations may take up to 2 weeks, sometimes less, sometimes longer, the patient is encouraged to get in touch with [place] if they do not hear back from the sleep lab staff directly within the next 2 weeks.  Thank you very much for allowing me to participate in the care of this nice patient. If I can be of any further assistance to you please do not hesitate to call me at [phone].  Sincerely,   Star Age, MD, PhD

## 2022-04-06 NOTE — Patient Instructions (Signed)
It was nice to meet you today.   As discussed, we will proceed with a home sleep test (HST) to re-assess your sleep apnea diagnosis and to see if you still should use your autoPAP machine. Our sleep lab staff will reach out to you to arrange for pickup and for tutorial of your test equipment - you will do the test at home that night and bring the test sensors back for data analysis the next day or whenever you are scheduled for drop off of your test equipment. I will write for a new machine after your HST confirms your obstructive sleep apnea diagnosis.   Please remember, you will not use your CPAP the night of your testing.  This is so we get diagnostic data, we do not need treatment data.  We will call you with the results.   After you have done your home sleep test, you can resume using your AutoPap machine.    Please continue using your PAP regularly. While your insurance requires that you use PAP at least 4 hours each night on 70% of the nights, I recommend, that you not skip any nights and use it throughout the night if you can. Getting used to CPAP or AutoPap and staying with the treatment long term does take time and patience and discipline. Untreated obstructive sleep apnea when it is moderate to severe can have an adverse impact on cardiovascular health and raise her risk for heart disease, arrhythmias, hypertension, congestive heart failure, stroke and diabetes. Untreated obstructive sleep apnea causes sleep disruption, nonrestorative sleep, and sleep deprivation. This can have an impact on your day to day functioning and cause daytime sleepiness and impairment of cognitive function, memory loss, mood disturbance, and problems focussing. Using CPAP/AutoPap regularly can improve these symptoms.  Please continue to work on weight loss.  You have used a significant amount of weight thus far.  Keep up the good work!

## 2022-04-07 NOTE — Progress Notes (Signed)
RE: requesting copy of sleep study for Dr. Frances Furbish. Received: [name], RN; New, [name],  I just faxed it to the requested number and your attn.  Thank you,  [name]     Previous Messages    ----- Message ----- From: [name], RN Sent: 04/06/2022   1:07 PM EST To: [name] New Subject: FW: requesting copy of sleep study for Dr. [name]*  [phone]  attn sandy ----- Message ----- From: [name] Sent: 04/06/2022  12:26 PM EST To: [name]; [name]; [name]; * Subject: RE: requesting copy of sleep study for Dr. [name]*  Hello,  Yes, I have a copy. What fax number / email would you like this sent to?  Thanks,  [name]  ----- Message ----- From: [name], RN Sent: 04/06/2022  12:13 PM EST To: [name]; [name]; [name]; * Subject: requesting copy of sleep study for Dr. Frances Furbish.  Good afternoon,  We saw the pt today as a new patient.  His DME is [facility].  I believe he said that you have sleep study.  Can I get a copy of this for Dr. Frances Furbish?  Gregory Gutierrez "[name]" Male, 41 y.o., 11/07/1980 MRN: [id number]   [facility] RN

## 2022-04-13 ENCOUNTER — Ambulatory Visit: Payer: 59 | Admitting: Physician Assistant

## 2022-04-13 ENCOUNTER — Encounter: Payer: Self-pay | Admitting: Physician Assistant

## 2022-04-13 ENCOUNTER — Telehealth: Payer: Self-pay

## 2022-04-13 VITALS — BP 116/66 | HR 60 | Ht 71.0 in | Wt 369.0 lb

## 2022-04-13 DIAGNOSIS — K439 Ventral hernia without obstruction or gangrene: Secondary | ICD-10-CM

## 2022-04-13 DIAGNOSIS — R1012 Left upper quadrant pain: Secondary | ICD-10-CM | POA: Diagnosis not present

## 2022-04-13 DIAGNOSIS — K5790 Diverticulosis of intestine, part unspecified, without perforation or abscess without bleeding: Secondary | ICD-10-CM

## 2022-04-13 MED ORDER — NA SULFATE-K SULFATE-MG SULF 17.5-3.13-1.6 GM/177ML PO SOLN: 1.0000 | Freq: Once | ORAL | 0 refills | Status: AC

## 2022-04-13 NOTE — Patient Instructions (Addendum)
We will contact you to schedule your colonoscopy at the hospital with a date.   If you are age 42 or older, your body mass index should be between 23-30. Your Body mass index is 51.47 kg/m. If this is out of the aforementioned range listed, please consider follow up with your Primary Care Provider.  If you are age 82 or younger, your body mass index should be between 19-25. Your Body mass index is 51.47 kg/m. If this is out of the aformentioned range listed, please consider follow up with your Primary Care Provider.   __________________________________________________________  The [facility] providers would like to encourage you to use [facility] to communicate with providers for non-urgent requests or questions.  Due to long hold times on the telephone, sending your provider a message by [facility] may be a faster and more efficient way to get a response.  Please allow 48 business hours for a response.  Please remember that this is for non-urgent requests.   Due to recent changes in healthcare laws, you may see the results of your imaging and laboratory studies on MyChart before your provider has had a chance to review them.  We understand that in some cases there may be results that are confusing or concerning to you. Not all laboratory results come back in the same time frame and the provider may be waiting for multiple results in order to interpret others.  Please give [place] 48 hours in order for your provider to thoroughly review all the results before contacting the office for clarification of your results.

## 2022-04-13 NOTE — Progress Notes (Signed)
Subjective:    Patient ID: Gregory Gutierrez, male    DOB: 03/17/1981, 41 y.o.   MRN: [id number]  HPI Gregory "Creig Hines" is a 41 year old Gregory Gutierrez male, established with Gregory Gutierrez, referred back today per Gregory Gutierrez for consideration of colonoscopy. Patient has history of GERD, hyperlipidemia, morbid obesity with BMI of 50, sleep apnea with CPAP use/no O2. He was last seen in our office 10/28/2021 as a new patient.  Had previously been seen by [facility] for GI issues. He had undergone EGD in October 2022 per Gregory Gutierrez that did show gastritis which was negative for H. pylori and a small hiatal hernia. He had developed upper abdominal pain about 7 to 8 months prior to that visit which primarily had been in the left upper quadrant and described as dull.  Per Gregory Gutierrez notes patient had had manipulation of his abdomen by a friend who is a physical therapist to "pulled on his diaphragm "which alleviated his discomfort and allowed him to start having normal bowel movements again.  He says his stools had not been normal and had been somewhat narrower prior to that time. He did undergo CT of the abdomen pelvis after that visit 11/2021 which was read as unremarkable other than a stable small left inguinal hernia which contained only fat.  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  Patient says that he had recently lost his job and has opted not to see a Psychologist, sport and exercise about any kind of surgical procedure at this time. He was then seen by his PCP Gregory Gutierrez and with his intermittent symptoms of occasional left upper quadrant pain he was recommended to have colonoscopy.  At the time of his last office visit he had been on Linzess 145 mcg daily and was given a trial of dicyclomine. He says currently he is  not having any problems with bowel movements and is not using Linzess or dicyclomine. He had been doing abdominal work with sit ups and crunches and says he has backed off on that which seems to have decreased the left upper quadrant discomfort.  Family history negative for colon cancer positive for a duodenal cancer in his father.  Review of Systems Pertinent positive and negative review of systems were noted in the above HPI section.  All other review of systems was otherwise negative.   Outpatient Encounter Medications as of 04/13/2022  Medication Sig   Ascorbic Acid (VITAMIN C PO) Take by mouth.   Cholecalciferol (VITAMIN D3 PO) Take 1 tablet by mouth daily.   Na Sulfate-K Sulfate-Mg Sulf 17.5-3.13-1.6 GM/177ML SOLN Take 1 kit by mouth once for 1 dose.   pantoprazole (PROTONIX) 40 MG tablet Take 40 mg by mouth 2 (two) times daily.   rosuvastatin (CRESTOR) 20 MG tablet TAKE 1 TABLET BY MOUTH EVERY DAY   dicyclomine (BENTYL) 10 MG capsule Take 1 to 2 capsules every 8 hours as needed. (Patient not taking: Reported on 04/06/2022)   linaclotide (LINZESS) 145 MCG CAPS capsule Take 1 capsule daily before breakfast.  Can increase to 2 capsules if needed. (Patient not taking: Reported on 04/13/2022)   Probiotic Product (PROBIOTIC PO) Take 1 capsule by mouth daily.   No facility-administered encounter medications on file as of 04/13/2022.   No  Known Allergies Patient Active Problem List   Diagnosis Date Noted   IBS (irritable bowel syndrome) 04/06/2022   Social History   Socioeconomic History   Marital status: Single    Spouse name: Not on file   Number of children: 0   Years of education: Not on file   Highest education level: Not on file  Occupational History   Occupation: Public Relations/ account manager/ commuication analyst  Tobacco Use   Smoking status: Former    Types: Cigarettes    Quit date: 2003    Years since quitting: 20.9   Smokeless tobacco: Never   Tobacco comments:     Smoked for 6 months in 20's  Vaping Use   Vaping Use: Never used  Substance and Sexual Activity   Alcohol use: Yes    Comment: social   Drug use: No   Sexual activity: Yes  Other Topics Concern   Not on file  Social History Narrative   Right Handed   Live By himself   1 cup of Coffee per Day   Social Determinants of Health   Financial Resource Strain: Not on file  Food Insecurity: Not on file  Transportation Needs: Not on file  Physical Activity: Not on file  Stress: Not on file  Social Connections: Not on file  Intimate Partner Violence: Not on file    Mr. Gregory Gutierrez's family history includes CVA in his father; Cancer in his father; Crohn's disease in his father and paternal grandmother; Heart attack in his paternal grandfather; Hyperlipidemia in his mother; Hypertension in his father; Pneumonia in his mother; Rheum arthritis in his mother; Stroke in his maternal grandfather.      Objective:    Vitals:   04/13/22 0911  BP: 116/66  Pulse: 60    Physical Exam Well-developed morbidly obese Gregory Gutierrez male in no acute distress.  Height, Weight, 369 BMI 51.4  HEENT; nontraumatic normocephalic, EOMI, PE R LA, sclera anicteric. Oropharynx; not examined today Neck; supple, no JVD Cardiovascular; regular rate and rhythm with S1-S2, no murmur rub or gallop Pulmonary; Clear bilaterally Abdomen; soft, obese nontender, nondistended, no palpable mass or hepatosplenomegaly, bowel sounds are active patient does have a left upper quadrant ventral hernia, palpable in the sitting position, not palpable with lying supine Rectal; not done today Skin; benign exam, no jaundice rash or appreciable lesions Extremities; no clubbing cyanosis or edema skin warm and dry Neuro/Psych; alert and oriented x4, grossly nonfocal mood and affect appropriate        Assessment & Plan:   #57 41 year old morbidly obese Gregory Gutierrez male with BMI of 51.4 who has had intermittent left upper quadrant pain/discomfort for  multiple months. CT imaging July 2023 unremarkable other than a small left inguinal hernia containing only fat. 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 On exam today in the sitting position he does have a left upper quadrant ventral hernia which is palpable.  Previous irregular bowel habits/constipation has resolved since he had undergone a manipulation of his diaphragm by a physical therapist, no longer having constipation or requiring Linzess or dicyclomine for discomfort.  PCP had referred to consider colonoscopy.  Patient is currently putting off any referral to a surgeon for repair of the ventral hernia as he has been out of work.  #2 sleep apnea with CPAP use no O2 #3.  GERD-stable on Protonix 40 mg twice daily #4 hyperlipidemia #5.  Morbid  obesity BMI 51   Plan; Colonoscopy is reasonable as he has not had prior colonoscopy, and will serve for clearance prior to any consideration of surgical repair of his left upper quadrant ventral hernia. Due to BMI colonoscopy will be scheduled at the hospital with Gregory Gutierrez.  Procedure was discussed in detail with the patient including indications risks and benefits and he is agreeable to proceed.  Further recommendations pending findings at colonoscopy. Nachelle Negrette S Bailen Geffre PA-C 04/13/2022   Cc: Shirline Frees, MD

## 2022-04-13 NOTE — Telephone Encounter (Signed)
Called patient back. Informed him that his colonoscopy with Dr. Adela Lank is on 06/18/21 at 7:30 am at [facility]. Informed him to be at the admission desk at the hospital at 6:00 AM. Prep Instruction was already provided today when the patient was in the office. Went over date and time over the phone. New Instruction with updated and time was sent to patient's MyChart. Advised patient to call with questions or concerns. Patient verbalized understanding.

## 2022-04-14 NOTE — Progress Notes (Signed)
Difficult situation.  He may have a hernia that is causing his symptoms there, however given his BMI he is a poor surgical candidate.  Do not feel strongly colonoscopy is going to show a clear source for his pain however can consider this to make sure nothing else is going on.  Ultimately I think he would benefit from weight loss if he is having chronic abdominal pain.  If we pursue colonoscopy this will need to be done at the hospital for anesthesia support given his body mass index greater than 50.  Currently backlog at the hospital I do not have good access to procedures there for a few months at least.  He will be added to wait list and contacted for scheduling when available.

## 2022-04-16 ENCOUNTER — Telehealth: Payer: Self-pay | Admitting: Neurology

## 2022-04-16 NOTE — Telephone Encounter (Signed)
Sent mychart message

## 2022-05-18 NOTE — Telephone Encounter (Signed)
[facility]- [facility] pending faxed notes.  HST- [employer] no auth req.

## 2022-05-20 NOTE — Telephone Encounter (Signed)
[employer] approved the [facility] auth: [id number] (exp. 05/18/22 to 09/08/22)  Patient requested HST to be scheduled.  HST- [employer] no auth req.  Patient is scheduled at [facility] for 06/16/22 at 9 AM.  Mailed packet to the patient.

## 2022-06-02 NOTE — Telephone Encounter (Signed)
Spoke with patient. He states the event only lasted 30 seconds. He states he has an appointment with an eye surgeon on 06/11/22. He called both our office and the eye doctor because he states he didn't know if this was a brain issue or an eye issue. I did advise him that when he has these symptoms this would be an emergency and he should call/have gone to the ER for emergency evaluation. I advised there could be a number of things that cause this, stroke being one, increased pressure on optic nerve. 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 Potentially if this happens again it could last longer and if it happens again he needs to immediately proceed to ER. I advised it is very important for him to see the eye doctor. Pt is aware he was only seen for sleep apnea consult here so with new issue he would need a referral to be seen here for this. PCP could also send referral if needed. Pt was very appreciative for the call. I told him I would let Dr Rexene Alberts know he is going to forego the sleep study altogether.

## 2022-06-02 NOTE — Telephone Encounter (Signed)
I called pt back and relayed the information per Dr. Guadelupe Sabin recommendation. He verbalized understanding.  Relayed has appt with pcp on Thursday for referral back to [place].  He is not wanting to go to ED, but I reiterated that in acute situations that is where he needs to go for evaluation.  He understands.

## 2022-06-02 NOTE — Telephone Encounter (Signed)
You have already addressed this, but to reiterate: I recommend the patient go to the emergency room ASAP if he has any recurrence of symptoms, he would have to call 911.  I also recommend he follow-up with his primary care physician ASAP.

## 2022-06-02 NOTE — Telephone Encounter (Signed)
Patient called and left a voicemail on my phone stating he is canceling his HST for 06/16/22 due to cannot afford it at this time.  But he stated that when we woke up on Saturday morning he had no vision in his left eye and is starting to have headaches. He wasn't sure if he needed to be seen.

## 2022-06-05 ENCOUNTER — Other Ambulatory Visit (HOSPITAL_BASED_OUTPATIENT_CLINIC_OR_DEPARTMENT_OTHER): Payer: Self-pay | Admitting: Internal Medicine

## 2022-06-05 DIAGNOSIS — G453 Amaurosis fugax: Secondary | ICD-10-CM

## 2022-06-11 ENCOUNTER — Encounter (HOSPITAL_COMMUNITY): Payer: Self-pay | Admitting: Gastroenterology

## 2022-06-15 ENCOUNTER — Telehealth: Payer: Self-pay | Admitting: Gastroenterology

## 2022-06-15 NOTE — Telephone Encounter (Signed)
Inbound call from patient is requesting to cancel his procedure on 06/18/22 at [facility] with Dr. Havery Moros. Patient states he does not want to pay over $3,000 for a procedure.

## 2022-06-15 NOTE — Telephone Encounter (Signed)
Okay, thanks Booker, I appreciate the follow up

## 2022-06-15 NOTE — Telephone Encounter (Signed)
[place] sorry to hear this.   Beth can you let him know given BMI over 50 unfortunately the hospital is the only place he can have a colonoscopy done for the anesthesia support. Not sure what is insurance is covering / not covering, and if anyone can review that with him.  [place] if he does wish to cancel, not sure if anyone who is booked for later in the month is willing to move up to go in there, or I can ask the rest of the practice if they need help getting a case done. Can you let me know? Thanks

## 2022-06-15 NOTE — Telephone Encounter (Signed)
Spoke with the patient. He flatly refuses to reschedule. He states he is unemployed and he will not be able to afford this. He looks forward to seeing [place] in the future. He declines to plan for a later date. He states, "I am one of these people that says what I mean, and mean what I say. I will let you know when I want to schedule."

## 2022-06-18 ENCOUNTER — Ambulatory Visit (HOSPITAL_COMMUNITY): Admission: RE | Admit: 2022-06-18 | Payer: 59 | Source: Home / Self Care | Admitting: Gastroenterology

## 2022-06-18 SURGERY — COLONOSCOPY WITH PROPOFOL
Anesthesia: Monitor Anesthesia Care

## 2022-06-18 SURGERY — Surgical Case
Anesthesia: *Unknown

## 2022-06-21 ENCOUNTER — Ambulatory Visit (HOSPITAL_BASED_OUTPATIENT_CLINIC_OR_DEPARTMENT_OTHER)
Admission: RE | Admit: 2022-06-21 | Discharge: 2022-06-21 | Disposition: A | Discharge status: Home / Self Care | Payer: 59 | Source: Ambulatory Visit | Attending: Internal Medicine | Admitting: Internal Medicine

## 2022-06-21 DIAGNOSIS — G453 Amaurosis fugax: Secondary | ICD-10-CM | POA: Insufficient documentation

## 2022-07-02 NOTE — Telephone Encounter (Signed)
-----   Message from Yetta Flock, MD sent at 07/02/2022  4:23 PM EST ----- Regarding: RE: remove from hospital list [place]. Got it okay, yes let's take him off. Thank you  ----- Message ----- From: Roetta Sessions, CMA Sent: 07/02/2022   8:31 AM EST To: Yetta Flock, MD Subject: RE: remove from hospital list                  Sorry.  Telephone encounter from 2-5.   "Spoke with the patient. He flatly refuses to reschedule. He states he is unemployed and he will not be able to afford this. He looks forward to seeing [place] in the future. He declines to plan for a later date. He states, "I am one of these people that says what I mean, and mean what I say. I will let you know when I want to schedule."  If we keep him on the list we would keep calling him to offer him dates.  I think we should remove him from the list and let him call us when he is ready.    ----- Message ----- From: Yetta Flock, MD Sent: 07/01/2022   5:10 PM EST To: Roetta Sessions, CMA Subject: RE: remove from hospital list                  Jan I am not sure I follow, can you clarify what TE is?  I do not see anything in his chart on 2-5.  Thanks ----- Message ----- From: Roetta Sessions, CMA Sent: 07/01/2022  11:20 AM EST To: Yetta Flock, MD Subject: remove from hospital list                      Can I remove patient from hospital wait list?  Please see TE from 2-5.  Thanks, Jan

## 2022-07-02 NOTE — Telephone Encounter (Signed)
Removing patient from Hospital waitlist

## 2023-02-01 IMAGING — CT CT CARDIAC CORONARY ARTERY CALCIUM SCORE
3 series · 14 of 20 positions shown, 16 images · non-contrast
Comparison: None.
COMPARISON: None.

Addendum:
EXAM:
OVER-READ INTERPRETATION  CT CHEST

The following report is an over-read performed by radiologist Dr.
Kxng Jessee [REDACTED] on 01/02/2021. This over-read
does not include interpretation of cardiac or coronary anatomy or
pathology. The coronary calcium score interpretation by the
cardiologist is attached.
CLINICAL DATA: 40M for cardiovascular disease risk stratification
Coronary Calcium Score
TECHNIQUE: A gated, non-contrast computed tomography scan of the heart was
performed using 3mm slice thickness. Axial images were analyzed on a
dedicated workstation. Calcium scoring of the coronary arteries was
performed using the Agatston method.

[Series 2: cascseq 2.0 sa36 70% (id) · axial · 0.49mm/px · z∈[-248,-158]mm · 4 of 76 slices shown]
[im 16/76  vessel]
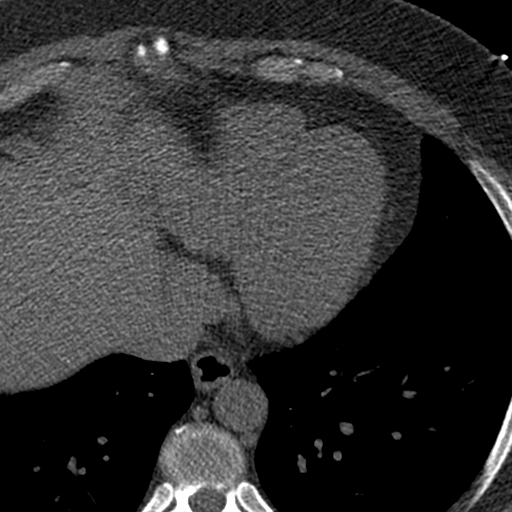
[im 31/76  vessel]
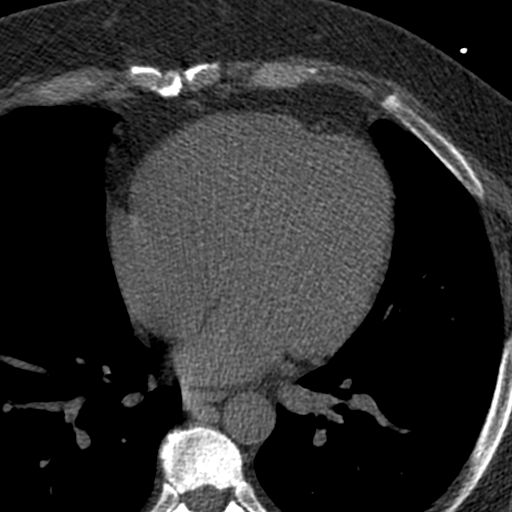
[im 46/76  vessel]
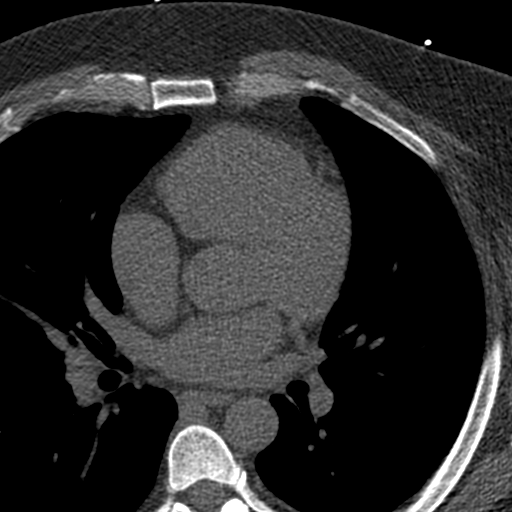
[im 61/76  vessel]
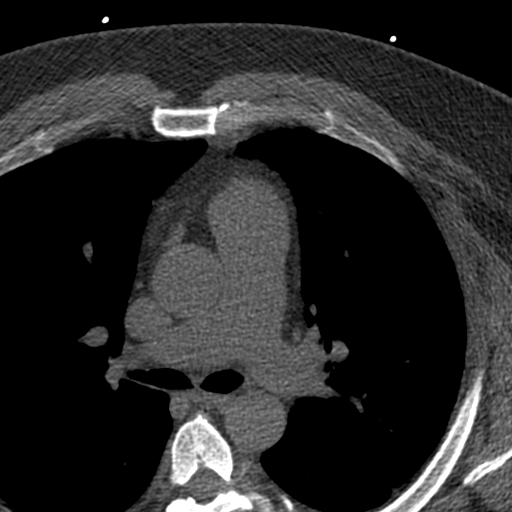

[Series 3: cascseq 2.0 bf37 st · axial · 0.87mm/px · z∈[-254,-154]mm · 5 of 76 slices shown, 7 images]
[im 13/76  vessel]
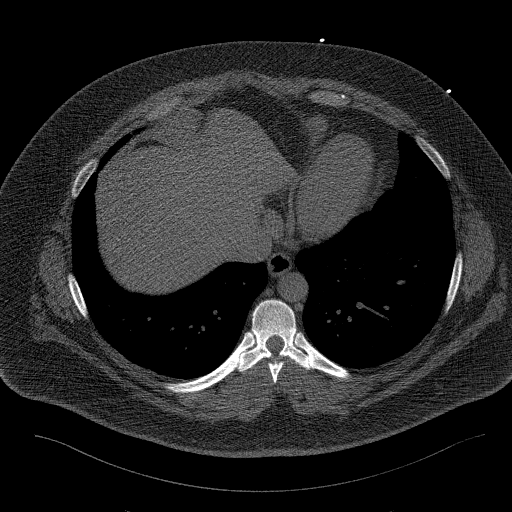
[im 13/76  lung]
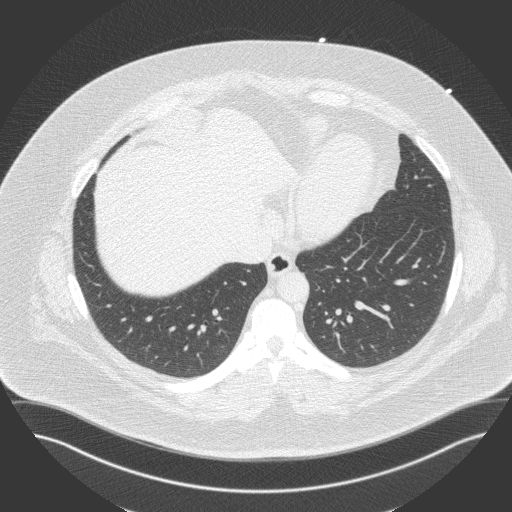
[im 26/76  vessel]
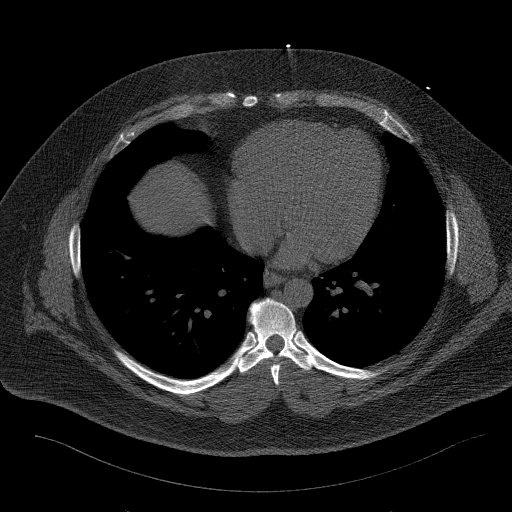
[im 38/76  vessel]
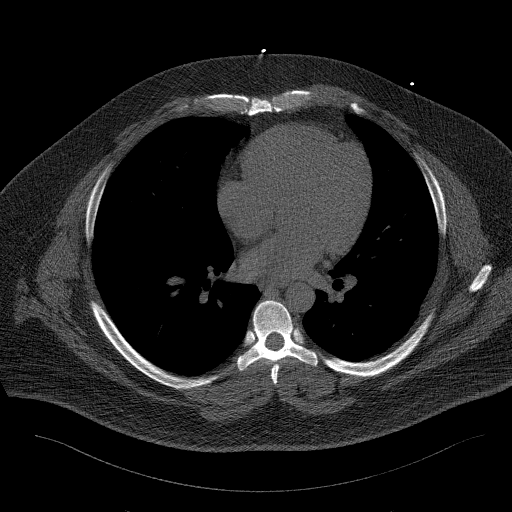
[im 51/76  vessel]
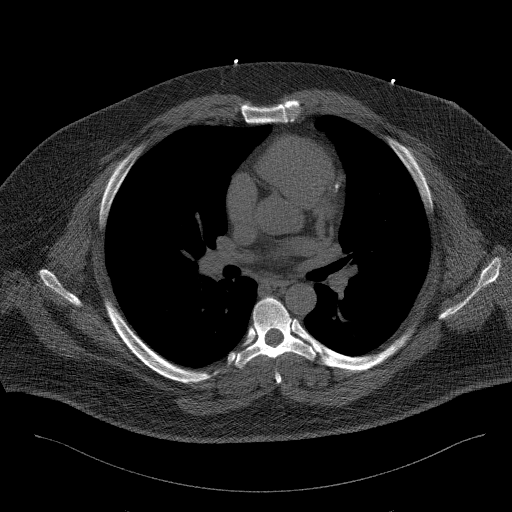
[im 63/76  vessel]
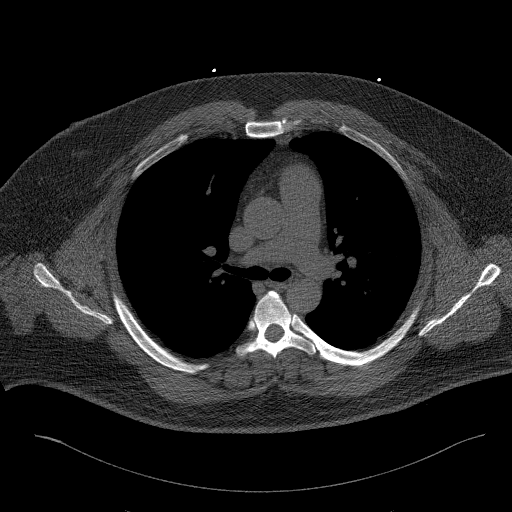
[im 63/76  lung]
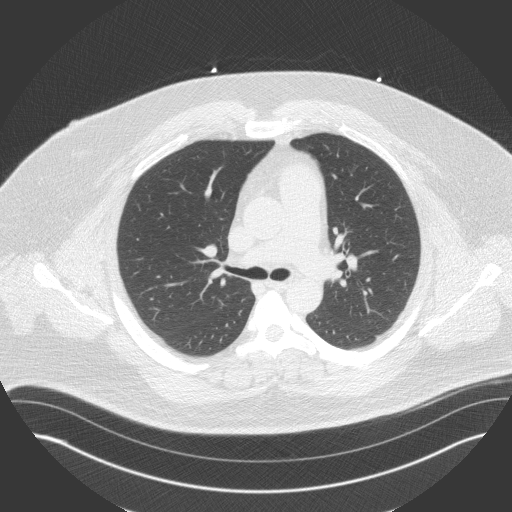

[Series 4: cascseq 2.0 br59 lung · axial · 0.87mm/px · z∈[-254,-154]mm · 5 of 76 slices shown]
[im 13/76  lung]
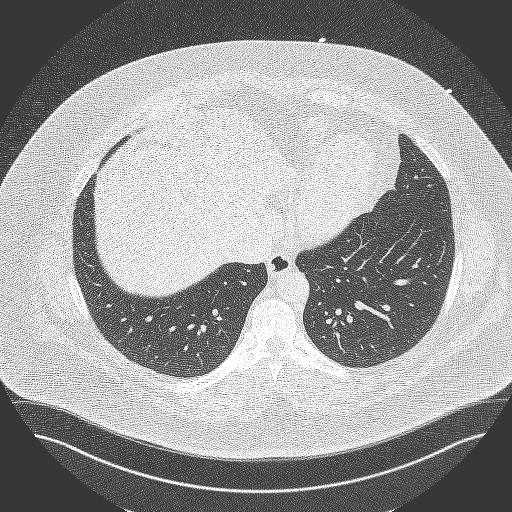
[im 26/76  lung]
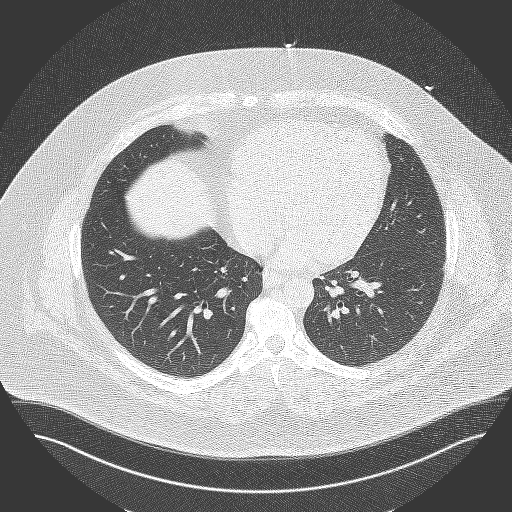
[im 38/76  lung]
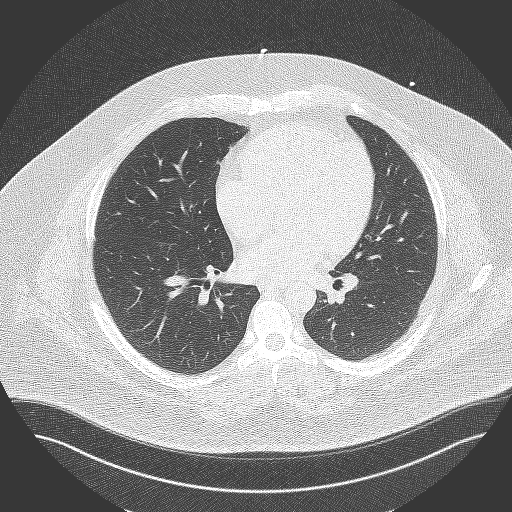
[im 51/76  lung]
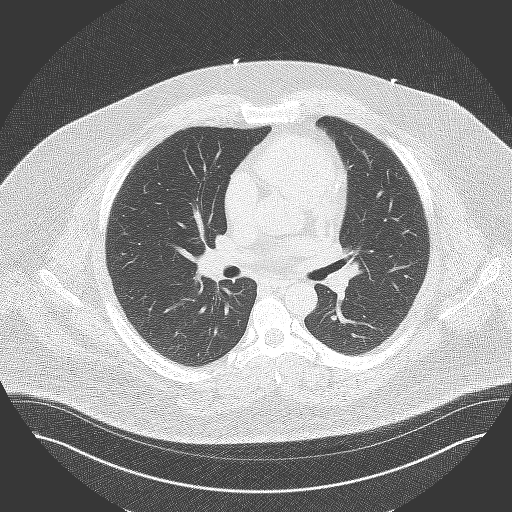
[im 63/76  lung]
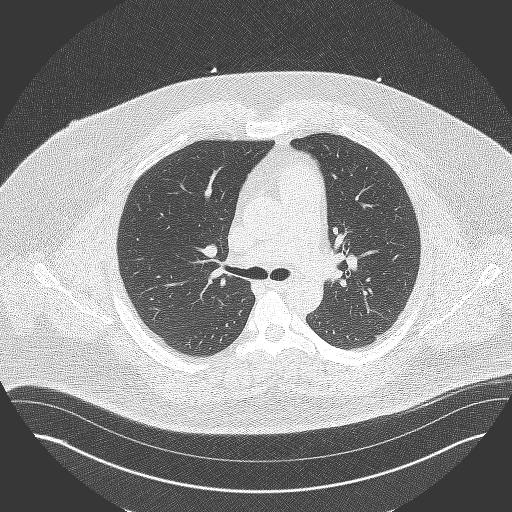

[14 of 20 positions shown; findings below may reference images not displayed]

FINDINGS: Vascular: Heart is normal size. Aorta normal caliber. Scattered
calcifications in the proximal ascending thoracic aorta and aortic
arch.

Mediastinum/Nodes: No adenopathy

Lungs/Pleura: No confluent opacity or effusion.

Upper Abdomen: Imaging into the upper abdomen demonstrates no acute
findings.

Musculoskeletal: Chest wall soft tissues are unremarkable. No acute
bony abnormality.
IMPRESSION: Scattered aortic atherosclerosis.

No acute extra cardiac abnormality.
FINDINGS: Coronary arteries: Normal origins.

Coronary Calcium Score:

Left main: 0

Left anterior descending artery: 190

Left circumflex artery:

Right coronary artery:

Total: 236

Percentile: The MESA cardiovascular risk calculator is validated for
[AGE]. Assuming this patient were age 45, this would be 98th
percentile for age-, race-, and sex-matched controls.

Pericardium: Normal.

Ascending Aorta: Normal caliber.  Calcification of the aortic root.

Non-cardiac: See separate report from [REDACTED].
IMPRESSION: Coronary calcium score of 236. The MESA cardiovascular risk
calculator is validated for [AGE]. Assuming this patient were
age 44, this would be 98th percentile for age-, race-, and
sex-matched controls. Given that he is younger, this likely
underestimates risk.



If CAC=0, it is reasonable to withhold statin therapy and reassess
in 5 to 10 years, as long as higher risk conditions are absent
(diabetes mellitus, family history of premature CHD in first degree
relatives (males <55 years; females <65 years), cigarette smoking,
or LDL >=190 mg/dL).

If CAC is 1 to 99, it is reasonable to initiate statin therapy for
patients >=55 years of age.

If CAC is >=100 or >=75th percentile, it is reasonable to initiate
statin therapy at any age.

Cardiology referral should be considered for patients with CAC
scores >=400 or >=75th percentile.

*7683 AHA/ACC/AACVPR/AAPA/ABC/MAXIMA/TELI/NINGUNO/Tiger/M HAMZA/LENDIO/CHRISTIAN JAY
Guideline on the Management of Blood Cholesterol: A Report of the
American College of Cardiology/American Heart Association Task Force
on Clinical Practice Guidelines. J Am Coll Cardiol.
9340;73(24):7697-7700.

*** End of Addendum ***
EXAM:
OVER-READ INTERPRETATION  CT CHEST

The following report is an over-read performed by radiologist Dr.
Kxng Jessee [REDACTED] on 01/02/2021. This over-read
does not include interpretation of cardiac or coronary anatomy or
pathology. The coronary calcium score interpretation by the
cardiologist is attached.
FINDINGS: Vascular: Heart is normal size. Aorta normal caliber. Scattered
calcifications in the proximal ascending thoracic aorta and aortic
arch.

Mediastinum/Nodes: No adenopathy

Lungs/Pleura: No confluent opacity or effusion.

Upper Abdomen: Imaging into the upper abdomen demonstrates no acute
findings.

Musculoskeletal: Chest wall soft tissues are unremarkable. No acute
bony abnormality.
IMPRESSION: Scattered aortic atherosclerosis.

No acute extra cardiac abnormality.

## 2024-06-02 ENCOUNTER — Ambulatory Visit: Admitting: Nurse Practitioner
